# Patient Record
Sex: Male | Born: 1960 | Race: White | Hispanic: Yes | Marital: Married | Smoking: Former smoker
Health system: Southern US, Community
[De-identification: ages and names within clinical notes are randomized; demographics above are authoritative.]

## PROBLEM LIST (undated history)

## (undated) DIAGNOSIS — I10 Essential (primary) hypertension: Secondary | ICD-10-CM

## (undated) DIAGNOSIS — E119 Type 2 diabetes mellitus without complications: Secondary | ICD-10-CM

---

## 2007-02-20 ENCOUNTER — Inpatient Hospital Stay (HOSPITAL_COMMUNITY): Admission: EM | Admit: 2007-02-20 | Discharge: 2007-02-26 | Payer: Self-pay | Admitting: Emergency Medicine

## 2007-04-01 ENCOUNTER — Ambulatory Visit: Payer: Self-pay | Admitting: Gastroenterology

## 2007-04-30 ENCOUNTER — Encounter (INDEPENDENT_AMBULATORY_CARE_PROVIDER_SITE_OTHER): Payer: Self-pay | Admitting: *Deleted

## 2007-04-30 ENCOUNTER — Encounter: Payer: Self-pay | Admitting: Gastroenterology

## 2007-04-30 ENCOUNTER — Ambulatory Visit: Payer: Self-pay | Admitting: Gastroenterology

## 2008-01-29 DIAGNOSIS — Z860100 Personal history of colon polyps, unspecified: Secondary | ICD-10-CM | POA: Insufficient documentation

## 2008-01-29 DIAGNOSIS — K5730 Diverticulosis of large intestine without perforation or abscess without bleeding: Secondary | ICD-10-CM | POA: Insufficient documentation

## 2008-07-26 ENCOUNTER — Ambulatory Visit: Payer: Self-pay | Admitting: Internal Medicine

## 2008-07-26 ENCOUNTER — Inpatient Hospital Stay (HOSPITAL_COMMUNITY): Admission: EM | Admit: 2008-07-26 | Discharge: 2008-07-30 | Payer: Self-pay | Admitting: Emergency Medicine

## 2008-08-13 ENCOUNTER — Inpatient Hospital Stay (HOSPITAL_COMMUNITY): Admission: EM | Admit: 2008-08-13 | Discharge: 2008-08-17 | Payer: Self-pay | Admitting: Emergency Medicine

## 2008-09-01 ENCOUNTER — Inpatient Hospital Stay (HOSPITAL_COMMUNITY): Admission: RE | Admit: 2008-09-01 | Discharge: 2008-09-06 | Payer: Self-pay | Admitting: Surgery

## 2008-09-01 ENCOUNTER — Encounter (INDEPENDENT_AMBULATORY_CARE_PROVIDER_SITE_OTHER): Payer: Self-pay | Admitting: Surgery

## 2009-08-26 IMAGING — CT CT ABDOMEN W/ CM
2 of 5 series · 15 of 46 positions shown, 17 images · IV contrast (APPLIED)
Comparison: CT abdomen pelvis 07/25/2008

CT ABDOMEN

CLINICAL DATA: Diverticulitis.

CT ABDOMEN AND PELVIS WITH CONTRAST
TECHNIQUE: Multidetector CT imaging of the abdomen and pelvis was
performed using the standard protocol following bolus
administration of intravenous contrast.
Contrast: 125 ml Gmnipaque-199

[Series 2: abd_pel 5.0 b40f st · axial · 0.78mm/px · z∈[-446,-36]mm · 12 of 94 slices shown, 14 images]
[im 6/94  soft-tissue]
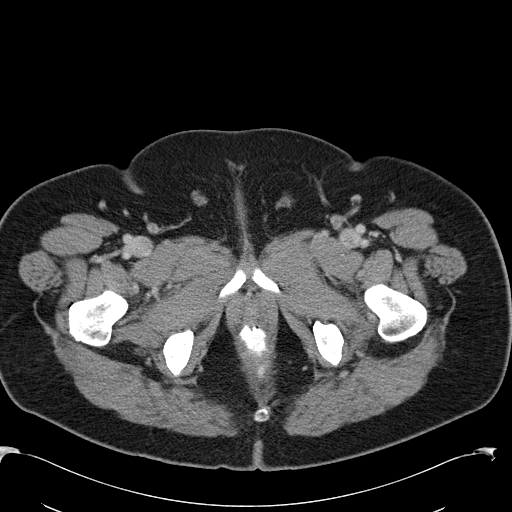
[im 6/94  bone]
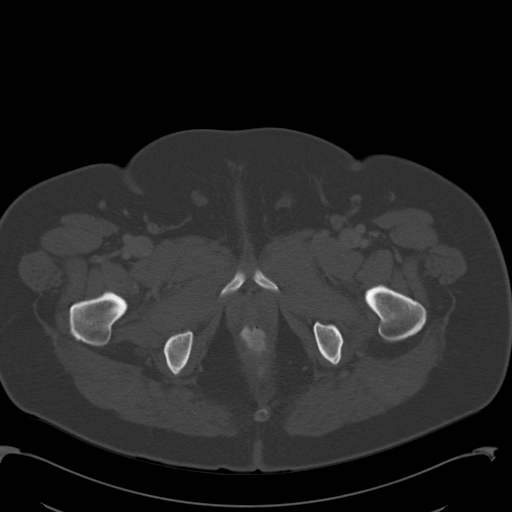
[im 16/94  soft-tissue]
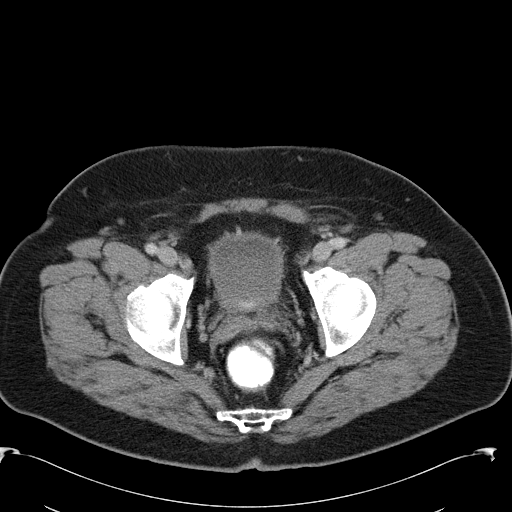
[im 21/94  soft-tissue]
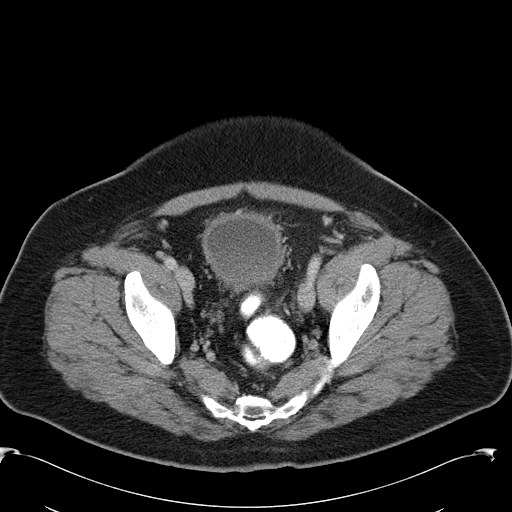
[im 26/94  soft-tissue]
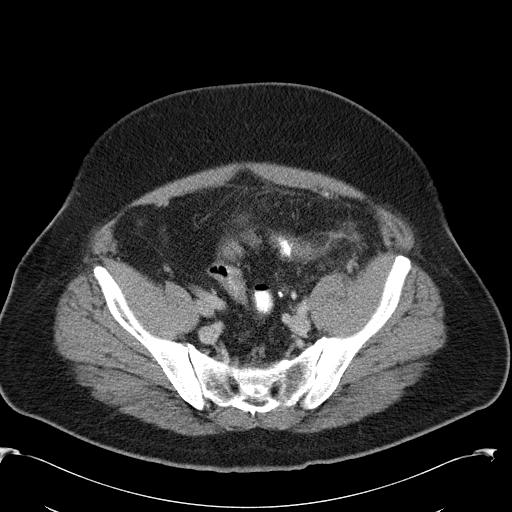
[im 37/94  soft-tissue]
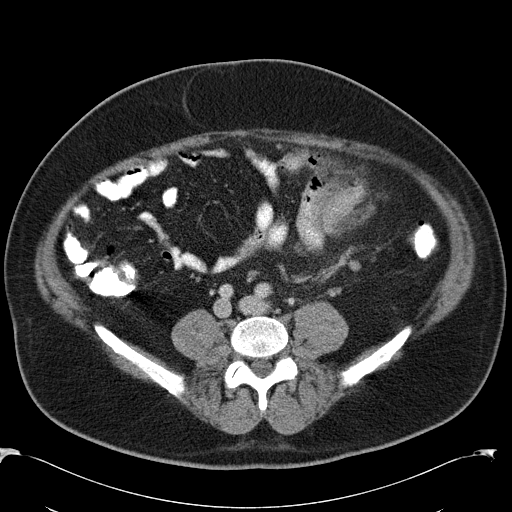
[im 42/94  soft-tissue]
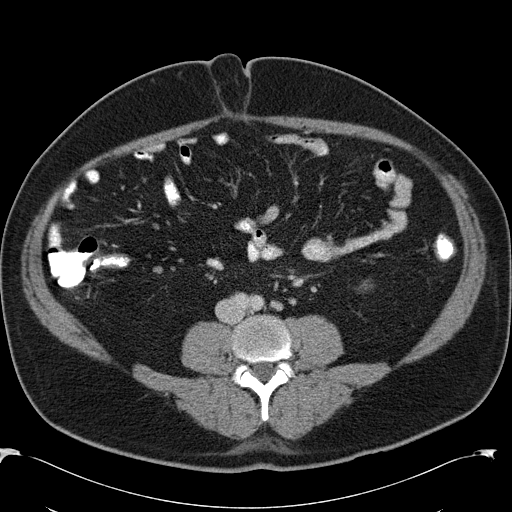
[im 52/94  soft-tissue]
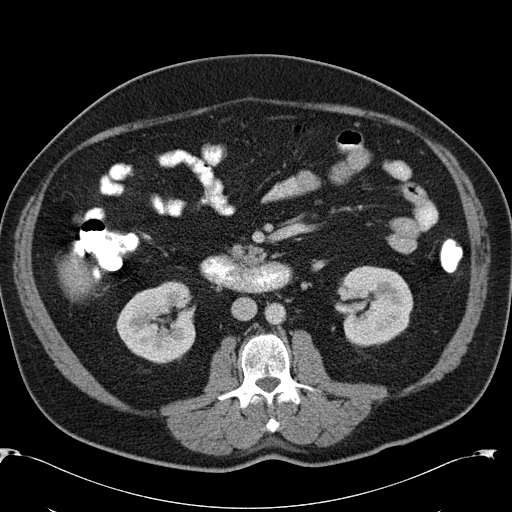
[im 57/94  soft-tissue]
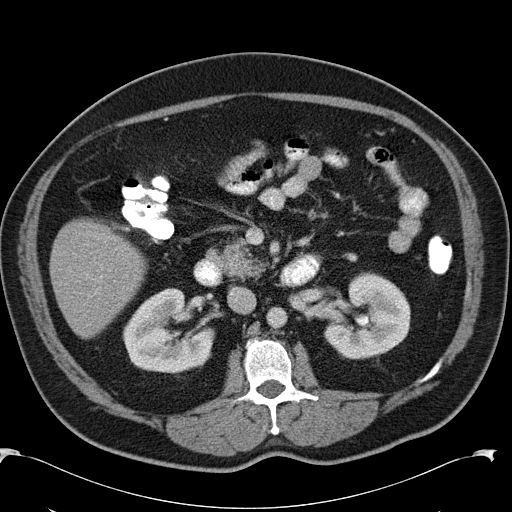
[im 68/94  soft-tissue]
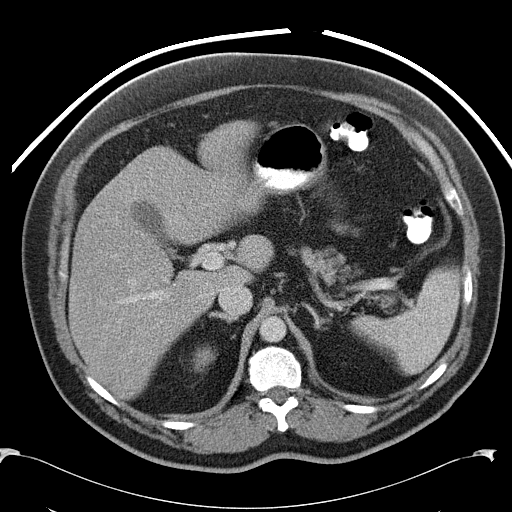
[im 68/94  bone]
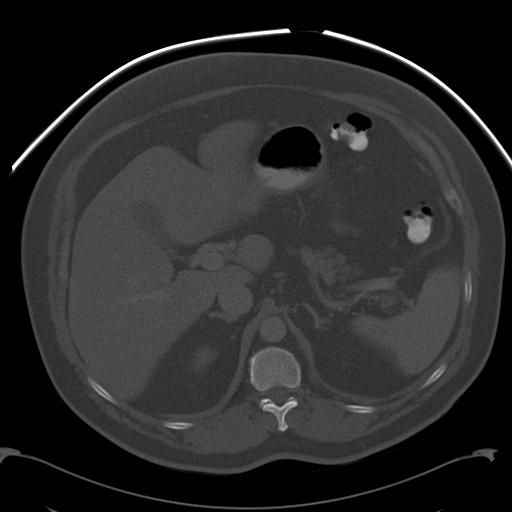
[im 73/94  soft-tissue]
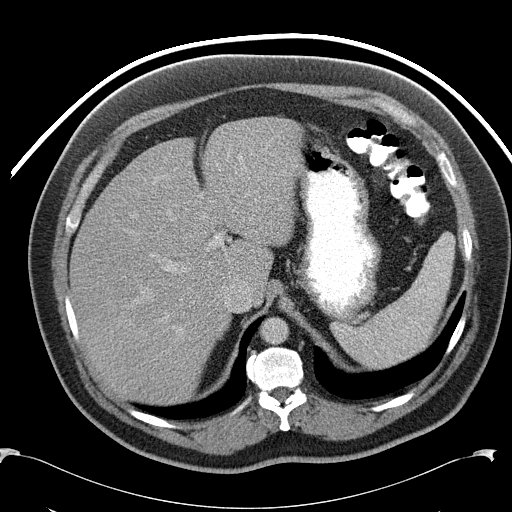
[im 78/94  soft-tissue]
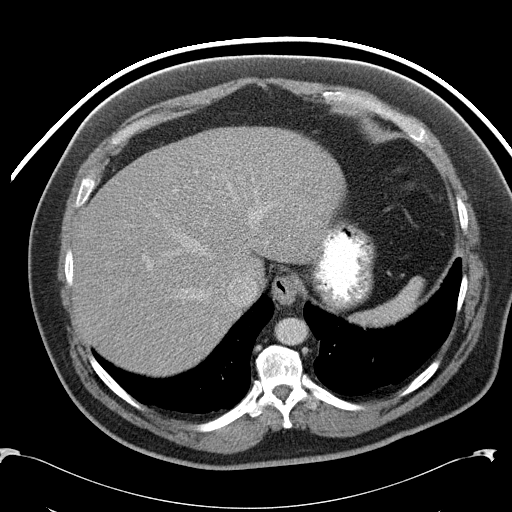
[im 88/94  soft-tissue]
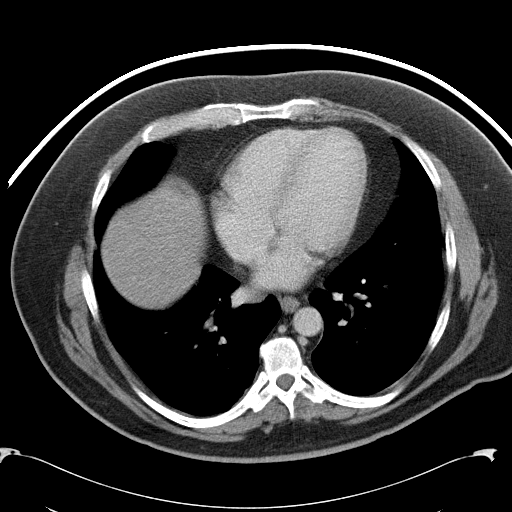

[Series 602: coronal abdomen · coronal · 0.95mm/px · 3 of 150 slices shown]
[im 50/150  soft-tissue]
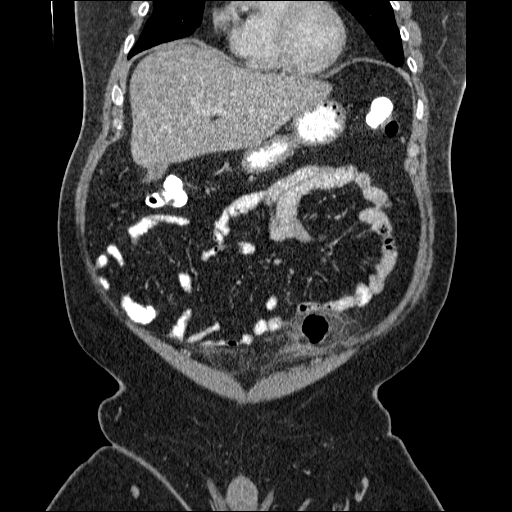
[im 67/150  soft-tissue]
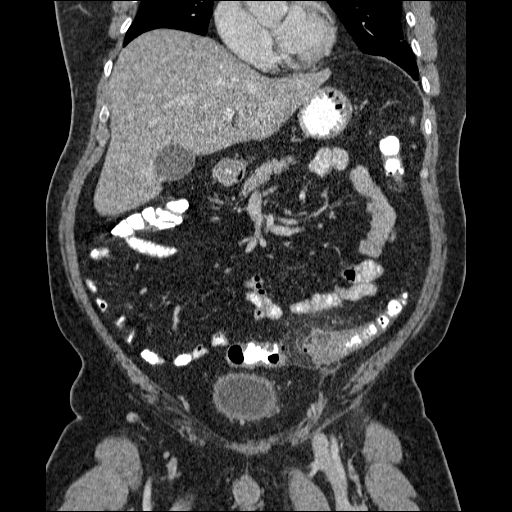
[im 83/150  soft-tissue]
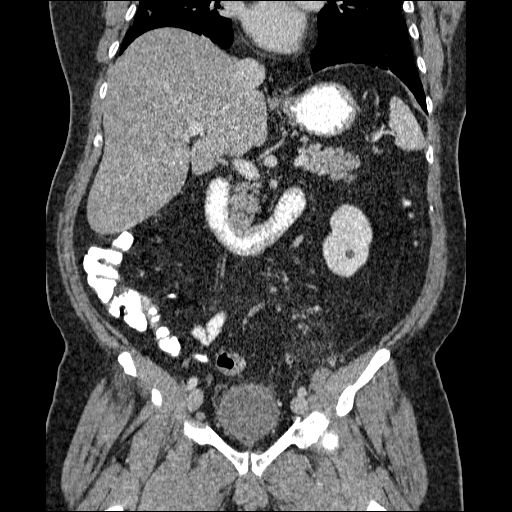

[15 of 46 positions shown; findings below may reference images not displayed]

FINDINGS: Small focal areas of atelectasis at the lung bases.  No
consolidation or pleural effusion is identified.  To the focal
metallic densities are noted just cephalad to the diaphragmatic
hiatus, adjacent to the within the mediastinal fat to the left of
the esophagus.

There is diffuse fatty infiltration of the liver without evidence
of a focal liver lesion.

The gallbladder, adrenal glands, spleen, pancreas, and right kidney
are normal.  Two circumscribed low density lesions in the lower
pole left kidney are noted, largest measuring 1 cm, are most
compatible with renal cyst.  There is no hydronephrosis.  The
ureters are not dilated.

Small bowel loops are normal in caliber and wall thickness.  There
is a paraumbilical hernia containing fat only.  The mouth of the
hernia is 1.7 cm.

Adjacent to the distal aorta is not 9 x 8 mm lymph node, within
normal limits.  No pathologically enlarged lymph nodes are
identified within the abdomen.

No free intraperitoneal air is identified within the abdomen.

No acute osseous abnormality.
IMPRESSION: 1.  No acute findings in the abdomen.
2.  Fatty infiltration of the liver.

CT PELVIS
FINDINGS: The sigmoid colon contains an approximately 7-8 cm
segment of marked wall thickening, in the region of numerous
sigmoid diverticula.  A focal low density area along the wall the
sigmoid colon in the region of thickening measures approximately
1.9 x 2.2 cm, and extends slightly cephalad from the sigmoid colon.
This area is similar in appearance compared to the CT from
07/25/2008 and may represent a small intramural/pericolonic
abscess.  Fat stranding in the region of the sigmoid colon
persists, and overall may be slightly improved compared to
07/25/2008.

There are extraluminal locules of gas in the sigmoid mesentery.
Compared to the prior examination, some of the locules appear
coalesced into one larger locule measuring 2.7 x 2.9 cm, and there
are several additional of punctate locules of gas in this region.
No definite free intraperitoneal gas is identified.  There are
several reactive size lymph nodes adjacent to the inflamed sigmoid
colon, measuring up to 1.0 cm (image #58 of series 2).  Small bowel
loops are not dilated.  The appendix is normal.  No free fluid is
seen in the dependent portion of the pelvis.  Oral contrast is seen
to the level of the rectum. There is no evidence of colonic
obstruction.

Left inguinal lymph node is mildly prominent measuring 1.2 x
cm.
IMPRESSION: 1.
1.  Overall, the acute diverticulitis with contained perforation
within the sigmoid mesentery are similar compared to 07/25/2008 as
described above. The extraluminal gas has coalesced into one larger
gas collection with a few smaller additional foci of gas.
Pericolonic fat stranding is slightly decreased.
2.  A small (1.9 x 2.2 cm) low density collection associated with
the sigmoid colon and extending cephalad may represent a small
intramural / pericolonic abscess.
3.  Reactive lymph nodes in the left lower quadrant.
4.  Consider a follow-up colonoscopy, after the patient's acute
symptoms have completely resolved, to exclude an underlying colonic
neoplasm.

## 2010-06-04 ENCOUNTER — Observation Stay (HOSPITAL_COMMUNITY): Admission: EM | Admit: 2010-06-04 | Discharge: 2010-06-05 | Payer: Self-pay | Admitting: Emergency Medicine

## 2010-06-04 ENCOUNTER — Ambulatory Visit: Payer: Self-pay | Admitting: Cardiovascular Disease

## 2010-06-13 ENCOUNTER — Telehealth: Payer: Self-pay | Admitting: Gastroenterology

## 2010-06-15 ENCOUNTER — Ambulatory Visit: Payer: Self-pay | Admitting: Internal Medicine

## 2010-06-15 ENCOUNTER — Encounter: Payer: Self-pay | Admitting: Gastroenterology

## 2010-06-15 DIAGNOSIS — I10 Essential (primary) hypertension: Secondary | ICD-10-CM | POA: Insufficient documentation

## 2010-06-15 DIAGNOSIS — K219 Gastro-esophageal reflux disease without esophagitis: Secondary | ICD-10-CM | POA: Insufficient documentation

## 2010-06-20 ENCOUNTER — Ambulatory Visit (HOSPITAL_COMMUNITY): Admission: RE | Admit: 2010-06-20 | Discharge: 2010-06-20 | Payer: Self-pay | Admitting: Gastroenterology

## 2010-06-20 ENCOUNTER — Ambulatory Visit: Payer: Self-pay | Admitting: Gastroenterology

## 2010-06-25 ENCOUNTER — Encounter: Payer: Self-pay | Admitting: Gastroenterology

## 2010-07-23 ENCOUNTER — Ambulatory Visit: Payer: Self-pay | Admitting: Gastroenterology

## 2010-07-27 ENCOUNTER — Emergency Department (HOSPITAL_COMMUNITY): Admission: EM | Admit: 2010-07-27 | Discharge: 2010-07-27 | Payer: Self-pay | Admitting: Emergency Medicine

## 2010-08-14 ENCOUNTER — Encounter: Payer: Self-pay | Admitting: Cardiology

## 2010-08-14 ENCOUNTER — Ambulatory Visit: Payer: Self-pay | Admitting: Cardiology

## 2010-08-14 DIAGNOSIS — R079 Chest pain, unspecified: Secondary | ICD-10-CM

## 2010-10-31 NOTE — Letter (Signed)
Summary: EGD Instructions  [facility]  [address] [place], [place] [address]   Phone: [phone]  Fax: [phone]       Jose [name]    November 28, 1960    MRN: [id number]       Procedure Day /Date: 06-20-10     Arrival Time: 9:00 AM      Procedure Time: 10:00 AM     Location of Procedure:                     [name]     [facility] ( Outpatient Registration)   PREPARATION FOR ENDOSCOPY   On 06-20-10 THE DAY OF THE PROCEDURE:  1.   No solid foods, milk or milk products are allowed after midnight the night before your procedure.  2.   Do not drink anything colored red or purple.  Avoid juices with pulp.  No orange juice.  3.  You may drink clear liquids untlil 6:00 AM  which is 4 hours before your procedure.                                                                                                CLEAR LIQUIDS INCLUDE: Water Jello Ice Popsicles Tea (sugar ok, no milk/cream) Powdered fruit flavored drinks Coffee (sugar ok, no milk/cream) Gatorade Juice: apple, white grape, white cranberry  Lemonade Clear bullion, consomm, broth Carbonated beverages (any kind) Strained chicken noodle soup Hard Candy   MEDICATION INSTRUCTIONS  Unless otherwise instructed, you should take regular prescription medications with a small sip of water as early as possible the morning of your procedure.          OTHER INSTRUCTIONS  You will need a responsible adult at least 50 years of age to accompany you and drive you home.   This person must remain in the waiting room during your procedure.  Wear loose fitting clothing that is easily removed.  Leave jewelry and other valuables at home.  However, you may wish to bring a book to read or an iPod/MP3 player to listen to music as you wait for your procedure to start.  Remove all body piercing jewelry and leave at home.  Total time from sign-in until discharge is approximately 2-3 hours.  You should go home  directly after your procedure and rest.  You can resume normal activities the day after your procedure.  The day of your procedure you should not:   Drive   Make legal decisions   Operate machinery   Drink alcohol   Return to work  You will receive specific instructions about eating, activities and medications before you leave.    The above instructions have been reviewed and explained to me by   _______________________    I fully understand and can verbalize these instructions _____________________________ Date _________

## 2010-10-31 NOTE — Miscellaneous (Signed)
  Clinical Lists Changes  Medications: Removed medication of OMEPRAZOLE 20 MG CPDR (OMEPRAZOLE) Take 1 capsule 30 min prior to breakfast and dinner Added new medication of DEXILANT 60 MG CPDR (DEXLANSOPRAZOLE) take 1 tab before breakfast once daily - Signed Rx of DEXILANT 60 MG CPDR (DEXLANSOPRAZOLE) take 1 tab before breakfast once daily;  #30 x 2;  Signed;  Entered by: Louis Meckel MD;  Authorized by: Louis Meckel MD;  Method used: Electronically to [facility]. [phone]*, [address], [place], [place], [place]  [address], Ph: [phone], Fax: [phone]    Prescriptions: DEXILANT 60 MG CPDR (DEXLANSOPRAZOLE) take 1 tab before breakfast once daily  #30 x 2   Entered and Authorized by:   Louis Meckel MD   Signed by:   Louis Meckel MD on 06/20/2010   Method used:   Electronically to        [facility]. [phone]* (retail)       [address]       [place], [place]  [address]       Ph: [phone]       Fax: [phone]   RxID:   [id number]

## 2010-10-31 NOTE — Letter (Signed)
Summary: Results Letter  [facility]  [address] [place], [place] [address]   Phone: [phone]  Fax: [phone]        June 25, 2010 MRN: [id number]    [facility] [address] [place], [place]  [address]    Dear Mr. HONAKER,   Your biopsies demonstrated inflammatory changes only.    Please follow the recommendations previously discussed.  Should you have any immediate concerns or questions, feel free to contact me at the office.    Sincerely,  Barbette Hair. Arlyce Dice, M.D., [facility]          Sincerely,  Louis Meckel MD  This letter has been electronically signed by your physician.  Appended Document: Results Letter Letter mailed to patient.

## 2010-10-31 NOTE — Procedures (Signed)
Summary: Upper Endoscopy w/DIL  Patient: Jose [name] Note: All result statuses are Final unless otherwise noted.  Tests: (1) Upper Endoscopy w/DIL (UED)  UED Upper Endoscopy w/DIL                             DONE     [facility]     [address] [place], [place]  [address]           ENDOSCOPY PROCEDURE REPORT           PATIENT:  Jose [name], Jose [name]  MR#:  [id number]     BIRTHDATE:  05/11/1961, 48 yrs. old  GENDER:  male           ENDOSCOPIST:  Barbette Hair. Arlyce Dice, MD     ASSISTANT:           PROCEDURE DATE:  06/20/2010     PROCEDURE:  EGD with biopsy, EGD with balloon dilatation     ASA CLASS:  Class II     INDICATIONS:  1) dysphagia  2) GERD           MEDICATIONS:   Fentanyl 75 mcg IV, Versed 6 mg IV, glycopyrrolate     (Robinal) 0.2 mg IV     TOPICAL ANESTHETIC:  Cetacaine Spray           DESCRIPTION OF PROCEDURE:   After the risks benefits and     alternatives of the procedure were thoroughly explained, informed     consent was obtained.  The  endoscope was introduced through the     mouth and advanced to the third portion of the duodenum, without     limitations.  The instrument was slowly withdrawn as the mucosa     was carefully examined.     <<PROCEDUREIMAGES>>           A nodule was found at the gastroesophageal junction. 5mm nodule     with normal overlying mucosa on esophageal side of GE junction.     Bxs taken (see image2).  The examination was otherwise normal.  A     stricture was found at the gastroesophageal junction. Early     esophageal stricture    Dilation was then performed at the     gastroesphageal junction           1) Dilator:  Balloon  Size(s):  15-16.5-18     Resistance:  minimal  Heme:  none     Appearance:           COMPLICATIONS:  None           ENDOSCOPIC IMPRESSION:     1) Nodule at the gastroesophageal junction     2) Stricture at the gastroesophageal junction - s/p balloon     dilitation     3) Otherwise normal  examination.     RECOMMENDATIONS:     1) call office to schedule an office visit for _3weeks     2) discontinue prilosec; begin dexilant           REPEAT EXAM:  No           ______________________________     Barbette Hair. Arlyce Dice, MD           CC:  Gabriel Earing, M.D.           n.     eSIGNED:   Barbette Hair. Kaplan at 06/20/2010 11:09  AM           Kreg Shropshire, [id number]  Note: An exclamation mark (!) indicates a result that was not dispersed into the flowsheet. Document Creation Date: 06/20/2010 11:10 AM _______________________________________________________________________  (1) Order result status: Final Collection or observation date-time: 06/20/2010 11:03 Requested date-time:  Receipt date-time:  Reported date-time:  Referring Physician:   Ordering Physician: Melvia Heaps [phone]) Specimen Source:  Source: Launa Grill Order Number: [id number] Lab site:

## 2010-10-31 NOTE — Assessment & Plan Note (Signed)
Summary: NP6/CHEST DISCOMFORT/JML   Referring Loreley Schwall:  na Primary Jeslynn Hollander:  Gabriel Earing, MD   CC:  chect pressure.  History of Present Illness: 50 year old male for evaluation of chest pain. Long history of esophageal problems. Stress echocardiogram in September of 2011 showed a hypertensive response but no ST changes and normal wall motion. Seen in the emergency room with chest pain in October and apparently symptoms improved with GI cocktail. Enzymes negative x1. Patient was noted to have the chest pain following esophageal dilatation. The pain was described as a pressure without radiation. It was substernal in location. There was mild nausea but no shortness of breath or diaphoresis. It did increase with lying flat. It was not exertional. It resolved after 2 hours. He otherwise denies dyspnea on exertion, orthopnea, PND, pedal edema, palpitations, syncope or chest pain. At the time of his chest pain he did note mild palpitations.  Current Medications (verified): 1)  Bisoprolol-Hydrochlorothiazide 10-6.25 Mg Tabs (Bisoprolol-Hydrochlorothiazide) .... One Tablet By Mouth Once Daily 2)  Dexilant 60 Mg Cpdr (Dexlansoprazole) .... Take 1 Tab Before Breakfast Once Daily 3)  Cipro 500 Mg Tabs (Ciprofloxacin Hcl) .[name].. 1 Tab By Mouth Two Times A Day 4)  Garlic .[name].. 1 Tab By Mouth Once Daily 5)  Multivitamins   Tabs (Multiple Vitamin) .[name].. 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 Remaining systems are negative.   Vital Signs:  Patient profile:   50 year old male Height:      68 inches Weight:      225 pounds BMI:     34.33 Pulse rate:   61 / minute Resp:     14 per minute BP sitting:   121 / 77  (left arm)  Vitals Entered By: Kem Parkinson (August 14, 2010 11:14 AM)  Physical Exam  General:  Well developed/well nourished in NAD Skin warm/dry Patient not depressed No peripheral clubbing Back-normal HEENT-normal/normal eyelids Neck supple/normal carotid upstroke bilaterally; no bruits; no JVD; no thyromegaly chest - CTA/ normal expansion CV - RRR/normal S1 and S2; no murmurs, rubs or gallops;  PMI nondisplaced Abdomen -NT/ND, no HSM, no mass, + bowel sounds, no bruit 2+ femoral pulses, no bruits Ext-no edema, chords, 2+ DP Neuro-grossly nonfocal     EKG  Procedure date:  08/14/2010  Findings:      Sinus rhythm with pvcs, occasional junctional escape beats and nonspecific Twave changes.  Impression & Recommendations:  Problem # 1:  CHEST PAIN (ICD-786.50) Symptoms seem most consistent with GI pain. Stress echocardiogram normal. No further cardiac workup. His updated medication list for this problem includes:    Bisoprolol-hydrochlorothiazide 10-6.25 Mg Tabs (Bisoprolol-hydrochlorothiazide) ..... One tablet by mouth once daily  Problem # 2:  GERD (ICD-530.81) Management per primary care. His updated medication list for this problem includes:    Dexilant 60 Mg Cpdr (Dexlansoprazole) .[name]... Take 1 tab before breakfast once daily  Problem # 3:  HYPERTENSION  (ICD-401.9) Blood pressure controlled on present medications. Will continue. His updated medication list for this problem includes:    Bisoprolol-hydrochlorothiazide 10-6.25 Mg Tabs (Bisoprolol-hydrochlorothiazide) ..... One tablet by mouth once daily  Problem # 4:  DIVERTICULOSIS OF COLON (ICD-562.10)  His updated medication list for this problem includes:    Cipro 500 Mg Tabs (Ciprofloxacin hcl) .[name]... 1 tab by mouth two times a day

## 2010-10-31 NOTE — Procedures (Signed)
Summary: Preparation for Endoscopy / [facility]  Preparation for Endoscopy / [facility]   Imported By: Lennie Odor 06/19/2010 14:56:33  _____________________________________________________________________  External Attachment:    Type:   Image     Comment:   External Document

## 2010-10-31 NOTE — Letter (Signed)
Summary: Results Letter  [facility]  [address] [place], [place] [address]   Phone: [phone]  Fax: [phone]        June 25, 2010 MRN: [id number]    [facility] [address] [place], [place]  [address]    Dear Mr. BECKMANN,   Your biopsies demonstrated inflammatory changes only.    Please follow the recommendations previously discussed.  Should you have any immediate concerns or questions, feel free to contact me at the office.    Sincerely,  Barbette Hair. Arlyce Dice, M.D., [facility]          Sincerely,  Louis Meckel MD  This letter has been electronically signed by your physician.  Appended Document: Results Letter Repeat letter, original mailed to patient.

## 2010-10-31 NOTE — Progress Notes (Signed)
Summary: Triage-Dysphagia  Phone Note Call from Patient Call back at [phone]   Caller: Patient Call For: Dr Arlyce Dice Reason for Call: Talk to Nurse Summary of Call: Patient is scheduled to see Dr Arlyce Dice on 10-14 but would like to speak to nurse regarding issues he's been having. Patient states that he has reflux and feels something stuck in his esophagus. Initial call taken by: Tawni Levy,  June 13, 2010 1:33 PM  Follow-up for Phone Call        Pt. having episodes of GERD that wake him up during the night, episodes becomming more frequent. 1 episode of dysphagia, happened today.   1) See Amy Esterwood PAC on 06-15-10 at 2pm 2) Prilosec OTC two times a day until appt. 3) Soft,bland diet. Be very careful with meats,rice and breads. 4) If symptoms become worse call back immediately or go to ER.  Follow-up by: Laureen Ochs LPN,  June 13, 2010 2:52 PM  Additional Follow-up for Phone Call Additional follow up Details #1::        ok Additional Follow-up by: Louis Meckel MD,  June 14, 2010 2:39 PM

## 2010-10-31 NOTE — Assessment & Plan Note (Signed)
Summary: WORSENING GERD/DYSPHAGIA   (DR.KAPLAN PT.)        History of Present Illness Visit Type: new patient  Primary GI MD: Melvia Heaps MD [facility] Primary Provider: Gabriel Earing, MD  Requesting Provider: na Chief Complaint: Severe GERD and nausea  History of Present Illness:    PLEASANT 50 Y.O MALE KNOWN TO DR. KAPLAN WITH HX OF DIVERTICULAR DISEASE . HE HAD A COLONOSCOPY IN 7/08 WITH LEFT SIDED DIVERTICULOSIS,AND A SMALL HAMARTOMATOUS POLYP. HE HAD A  SIGMOID RESECTION IN 5/09.  HE COMES IN NOW WITH A NEW COMPLAINT. HE HAS BEEN HAVING FREQUENT HEARTBURN,INCLUDING NOCTURNAL EPISODES OF REFLUX WAKING HIM UP. HE HAD AN EPISODE OF PRESYNCOPE /DIZZINESS ON 9/5 AFTER MOVING HIS LAWN WHICH TOOK HIM TO THE ER. HE HAD EVALUATION INCLUDING A STRESS TEST WHICH WAS REPORTED AS NEGATIVE, AND WAS TOLD HE HAD HEAT EXHAUSTION/DEHYDRATION. ON 06/06/10 HE ATE A BANANA THEN FELT AS IF IT SAT IN HIS ESOPHAGUS.[name] HE DEVELOPEDD A PRESSURE IN HIS CHEST AND ESOPHAGUS. WHICH CAME UP TO HIS THROAT.HE WAS UNCOMFORATBLE THE REST OF THE DAY. HE HAS FELT UNCOMFORTABLE  EATING EVER SINCE, AND IS EATING VERY SOFT MUSHY FOODS WHICH ARE GOING DOWN FINE. IF HE EATS SOMETHING SOLID HE FEELS PRESSURE IN HIS CHEST. OCCASIONAL VAGUE NAUSEA, NO VOMITING OR REGURGITATION. NO C/O ABDOMINAL PAIN. HE STARTED PRILOSEC TWICE DAILY 2 DAYS AGO, NO HEARTBURN SINCE.    GI Review of Systems    Reports acid reflux, chest pain, dysphagia with solids, heartburn, loss of appetite, and  nausea.      Denies abdominal pain, belching, bloating, dysphagia with liquids, vomiting, vomiting blood, weight loss, and  weight gain.      Reports diverticulosis.     Denies anal fissure, black tarry stools, change in bowel habit, constipation, diarrhea, fecal incontinence, heme positive stool, hemorrhoids, irritable bowel syndrome, jaundice, light color stool, liver problems, rectal bleeding, and  rectal pain.    Current Medications (verified): 1)  Prilosec  Otc 20 Mg Tbec (Omeprazole Magnesium) .... One Tablet By Mouth in The Morning and One Tablet By Mouth At Bedtime 2)  Bisoprolol-Hydrochlorothiazide 10-6.25 Mg Tabs (Bisoprolol-Hydrochlorothiazide) .... 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  Alcohol Use - no Daily Caffeine Use: one daily  Illicit Drug Use - no Smoking Status:  quit Drug Use:  no  Review of Systems       The patient complains of headaches-new.  The patient denies allergy/sinus, anemia, anxiety-new, arthritis/joint pain, back pain, blood in urine, breast changes/lumps, change in vision, confusion, cough, coughing up blood, depression-new, fainting, fatigue, fever, hearing problems, heart murmur, heart rhythm changes, itching, muscle pains/cramps, night sweats, nosebleeds, shortness of breath, skin rash, sleeping problems, sore throat, swelling of feet/legs, swollen lymph glands, thirst - excessive, urination - excessive, urination changes/pain, urine leakage, vision changes, and voice change.         SEE HPI  Vital Signs:  Patient profile:   50 year old male Height:      68 inches Weight:      228 pounds BMI:     34.79 BSA:     2.16  Pulse rate:   88 / minute Pulse rhythm:   regular BP sitting:   142 / 86  (left arm) Cuff size:   regular  Vitals Entered By: Ok Anis CMA (June 15, 2010 2:07 PM)  Physical Exam  General:  Well developed, well nourished, no acute distress. Head:  Normocephalic and atraumatic. Eyes:  PERRLA, no icterus. Lungs:  Clear throughout to  auscultation. Heart:  Regular rate and rhythm; no murmurs, rubs,  or bruits. Abdomen:  OBESE, SOFT, NONTENDER, NO MASS OR HSM,BS+ Rectal:  NOT DONE Extremities:  No clubbing, cyanosis, edema or deformities noted. Neurologic:  Alert and  oriented x4;  grossly normal neurologically. Psych:  Alert and cooperative. Normal mood and affect.   Impression & Recommendations:  Problem # 1:  DYSPHAGIA (ICD-787.29) Assessment New 50 YO MALE WITH FREQUENT HEARTBURN,PREVIOUSLY UNTREATED, NOCTURNAL REFLUX, NOW WITH ONE WEEK HX OF SOLID FOOD DYSPHAGIA/ODYNOPHAGIA. 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  ANTIREFLUX REGIMEN REVIEWED AND EDUCATIONAL MATERIALS GIVEN CONTINUE PRILOSEC 20 MG  TWICE DAILY FOR NOW. SCHEDULE FOR UPPER ENDOSCOPY  WITH ESOPHAGEAL DILATION WITH DR. KAPLAN NEXT WEEK-PROCEDURE DISCUSSED IN DETAIL WITH PT AND HIS WIFE. CONTINUE SOFT DIET.  Problem # 2:  GERD (ICD-530.81) Assessment: Comment Only SEE ABOVE Orders: ZEGD Balloon Dil (ZEGD Balloon)  Problem # 3:  DIVERTICULOSIS OF COLON (ICD-562.10) Assessment: Comment Only S/P SIGMOID RESECTION 5/09  Problem # 4:  COLONIC POLYPS, HX OF (ICD-V12.72) Assessment: Comment Only HAMARTOMATOUS-FOLLOW UP COLON 2018  Problem # 5:  HYPERTENSION (ICD-401.9) Assessment: Comment Only  Problem # 6:  DIZZINESS Assessment: Comment Only PT MENTIONS SOME DIZZINESS ASSOCIATED  WITH DYSPHAGIA,INTERMITTENTLY-RECENT STRESS TEST REPORTED AS NEGATIVE. ? ANXIETY. IF SXS PERSIST  WILL NEE FURTHER WORKUP.  Patient Instructions: 1)  We have given you samples of Prilosec, take 1 capsule 30 min prior to breakfast  and dinner. 2)  We sent a prescription for Omeprazole to [facility]. 3)  We scheduled the Endoscopy with Dr Melvia Heaps at [facility] on the 1st floor. 4)  Directions and brochure provided. 5)  Reflux precaution brochures provided. 6)  Copy sent to : Gabriel Earing, MD 7)  The medication  list was reviewed and reconciled.  All changed / newly prescribed medications were explained.  A complete medication list was provided to the patient / caregiver. Prescriptions: OMEPRAZOLE 20 MG CPDR (OMEPRAZOLE) Take 1 capsule 30 min prior to breakfast and dinner  #60 x 3   Entered by:   Lowry Ram NCMA   Authorized by:   Sammuel Cooper PA-c   Signed by:   Lowry Ram NCMA on 06/15/2010   Method used:   Electronically to        [facility]. [phone]* (retail)       [address]       [place], [place]  [address]       Ph: [phone]       Fax: [phone]   RxID:   [id number]

## 2010-10-31 NOTE — Assessment & Plan Note (Signed)
Summary: f/u from endo--ch.   History of Present Illness Visit Type: Follow-up Visit Primary GI MD: Jose Heaps MD [facility] Primary Provider: Gabriel Earing, MD  Requesting Provider: na Chief Complaint: follow-up Endo denies heartburn but does c/o watery mouth, dizziness, fast heartbeat, and nausea.   History of Present Illness:   [name] has returned following t upper endoscopy and dilatation.  A distal stricture was dilated to 18 mm.  On a regimen of DEXILANT he is now completely free of pyrosis.  He no longer has dysphagia.   GI Review of Systems    Reports nausea.      Denies abdominal pain, acid reflux, belching, bloating, chest pain, dysphagia with liquids, dysphagia with solids, heartburn, loss of appetite, vomiting, vomiting blood, weight loss, and  weight gain.        Denies anal fissure, black tarry stools, change in bowel habit, constipation, diarrhea, diverticulosis, fecal incontinence, heme positive stool, hemorrhoids, irritable bowel syndrome, jaundice, light color stool, liver problems, rectal bleeding, and  rectal pain.    Current Medications (verified): 1)  Bisoprolol-Hydrochlorothiazide 10-6.25 Mg Tabs (Bisoprolol-Hydrochlorothiazide) .... One Tablet By Mouth Once Daily 2)  Dexilant 60 Mg Cpdr (Dexlansoprazole) .... Take 1 Tab Before Breakfast Once Daily  Allergies (verified): No Known Drug Allergies  Past History:  Past Medical History: Reviewed history from 06/15/2010 and no changes required.  GERD (ICD-530.81) HYPERTENSION (ICD-401.9) COLONIC POLYPS, HX OF (ICD-V12.72)/HAMARTOMATOUS 2008 DIVERTICULOSIS OF COLON (ICD-562.10)  Past Surgical History: Reviewed history from 06/15/2010 and no changes required. Colon Resection--2009  COLONOSOPY 7/08 Jose [name])  Family History: Reviewed history from 06/15/2010 and no changes required. No FH of Colon Cancer: Family History of Diabetes: Mother   Social History: Reviewed history from 06/15/2010 and no  changes required. 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  Alcohol Use - no Daily Caffeine Use: one daily  Illicit Drug Use - no  Review of Systems       The patient complains of headaches-new.  The patient denies allergy/sinus, anemia, anxiety-new, arthritis/joint pain, back pain, blood in urine, breast changes/lumps, change in vision, confusion, cough, coughing up blood, depression-new, fainting, fatigue, fever, hearing problems, heart murmur, heart rhythm changes, itching, menstrual pain, muscle pains/cramps, night sweats, nosebleeds, pregnancy symptoms, shortness of breath, skin rash, sleeping problems, sore throat, swelling of feet/legs, swollen lymph glands, thirst - excessive , urination - excessive , urination changes/pain, urine leakage, vision changes, and voice change.         He has had a couple of episodes of lightheadedness, excess salivation and nausea with tachycardia  Vital Signs:  Patient profile:   50 year old male Height:      68 inches Weight:      225 pounds BMI:     34.33 Pulse rate:   68 / minute Pulse rhythm:   regular BP sitting:   138 / 82  (left arm)  Vitals Entered By: Jose [name] NCMA (July 23, 2010 1:45 PM)   Impression & Recommendations:  Problem # 1:  DYSPHAGIA (ICD-787.29) Assessment Improved Plan repeat dilatation p.r.n. for a esophageal stricture  Problem # 2:  GERD (ICD-530.81)  Plan to continue DEXILANT and attempt to wean  Problem # 3:  COLONIC POLYPS, HX OF (ICD-V12.72) Hamartomatous polyp removed in 2008.  Plan followup colonoscopy in 2013  Patient Instructions: 1)  Copy sent to :Jose Earing, MD   2)  Call back as needed  3)  The medication list was reviewed and reconciled.  All  changed / newly prescribed medications were explained.  A complete medication list was provided to the patient / caregiver.

## 2010-11-01 ENCOUNTER — Telehealth (INDEPENDENT_AMBULATORY_CARE_PROVIDER_SITE_OTHER): Payer: Self-pay | Admitting: *Deleted

## 2010-11-07 NOTE — Progress Notes (Signed)
Summary: Records Request  Faxed OV & EKG to Dr. Gabriel Earing at [facility] ([phone]).  Debby Freiberg  November 01, 2010 2:13 PM

## 2010-12-12 LAB — POCT CARDIAC MARKERS: Troponin i, poc: <0.05 ng/mL (ref 0.00–0.09)

## 2010-12-13 LAB — POCT CARDIAC MARKERS
CKMB, poc: <1 ng/mL — ABNORMAL LOW (ref 1.0–8.0)
CKMB, poc: <1 ng/mL — ABNORMAL LOW (ref 1.0–8.0)
Myoglobin, poc: 39 ng/mL (ref 12–200)
Myoglobin, poc: 55.7 ng/mL (ref 12–200)
Myoglobin, poc: 64.3 ng/mL (ref 12–200)
Troponin i, poc: <0.05 ng/mL (ref 0.00–0.09)
Troponin i, poc: <0.05 ng/mL (ref 0.00–0.09)
Troponin i, poc: <0.05 ng/mL (ref 0.00–0.09)

## 2010-12-13 LAB — POCT I-STAT, CHEM 8
BUN: 15 mg/dL (ref 6–23)
Calcium, Ion: 1.14 mmol/L (ref 1.12–1.32)
Chloride: 106 mEq/L (ref 96–112)
Creatinine, Ser: 0.8 mg/dL (ref 0.4–1.5)
TCO2: 24 mmol/L (ref 0–100)

## 2010-12-13 LAB — CBC
HCT: 44.4 % (ref 39.0–52.0)
Hemoglobin: 15.2 g/dL (ref 13.0–17.0)
MCV: 85.5 fL (ref 78.0–100.0)
RBC: 5.19 MIL/uL (ref 4.22–5.81)
WBC: 14.5 10*3/uL — ABNORMAL HIGH (ref 4.0–10.5)

## 2010-12-13 LAB — DIFFERENTIAL
Eosinophils Relative: 1 % (ref 0–5)
Lymphocytes Relative: 13 % (ref 12–46)
Lymphs Abs: 1.9 10*3/uL (ref 0.7–4.0)
Monocytes Absolute: 1 10*3/uL (ref 0.1–1.0)
Neutro Abs: 11.5 10*3/uL — ABNORMAL HIGH (ref 1.7–7.7)

## 2010-12-13 LAB — URINALYSIS, ROUTINE W REFLEX MICROSCOPIC
Ketones, ur: NEGATIVE mg/dL
Nitrite: NEGATIVE
Urobilinogen, UA: 0.2 mg/dL (ref 0.0–1.0)
pH: 5 (ref 5.0–8.0)

## 2011-02-12 NOTE — Discharge Summary (Signed)
NAME:  Jose [name], Jose [name]              ACCOUNT NO.:  [id number]   MEDICAL RECORD NO.:  [id number]          PATIENT TYPE:  INP   LOCATION:  1522                         [facility]ACILITY[facility]  [facility]   PHYSICIAN:  Thomasenia Bottoms, MDDATE OF BIRTH:  1961/07/08   DATE OF ADMISSION:  02/20/2007  DATE OF DISCHARGE:  02/26/2007                               DISCHARGE SUMMARY   ADDENDUM:  Please refer to the discharge summary already dictated by Dr.  Darnelle Catalan.  The patient remained in the hospital for 2 days after that  discharge summary was dictated.  He continued to have a lot of trouble  with bloating and some mild abdominal pain once his diet was advanced;  therefore, we decided to keep his in the hospital until he was less  symptomatic.   By the morning of discharge, he is able to eat solid food.  No  significant abdominal pain.  He is having normal bowel movements, not  diarrhea, and is feeling significantly better.  He is afebrile.  His  white count is still elevated; however, clinically, he is doing much  better and will be discharged to continue his course of antibiotics.   FOLLOWUP INSTRUCTIONS:  He will follow up with Dr. Manus Rudd of  general surgery 2-3 weeks after discharge.   MEDICATIONS ON DISCHARGE:  Include Cipro 500 mg p.o. b.i.d. #14 given  and Flagyl 500 mg p.o. q.6 h. #28 given.   Again, refer to the discharge summary dictated by Dr. Ethelene Hal for details  of hospital course and problem list.      Thomasenia Bottoms, MD  Electronically Signed     CVC/MEDQ  D:  02/26/2007  T:  02/26/2007  Job:  [id number]   cc:   Ulis Rias, MD  [facility]  [facility]

## 2011-02-12 NOTE — Consult Note (Signed)
NAME:  Jose [name], Jose [name]              ACCOUNT NO.:  [id number]   MEDICAL RECORD NO.:  [id number]          PATIENT TYPE:  INP   LOCATION:  1223                         FACILITY:  [facility]   PHYSICIAN:  [name]. [name], M.D. DATE OF BIRTH:  05/18/1961   DATE OF CONSULTATION:  DATE OF DISCHARGE:                                 CONSULTATION   DATE OF CONSULTATION:  02/20/2007.   CONSULTING PHYSICIAN:  Dr. Trula Ore Rama [facility] hospitalist.   REASON FOR CONSULTATION:  Diverticulitis.   HISTORY OF PRESENT ILLNESS:  The patient is a 50 year old Hispanic male  who was on a business trip 2 days ago when he began experiencing some  left lower quadrant pain.[name]  He initially felt that he was constipated.  He took some over-the-counter laxatives and has had some bowel movements  but there has been no relief in his left lower quadrant pain.  He denies  any fever.  He did have some chills.  He denies any hematochezia or  melena.  This morning he felt lightheaded and had a presyncopal episode.  He was brought to the emergency department for evaluation.   PAST MEDICAL HISTORY:  None.   PAST SURGICAL HISTORY:  None.   ALLERGIES:  None.   MEDICATIONS:  None.   FAMILY HISTORY:  His father is deceased from pneumonia and alcoholism.  Mother is alive with diabetes.   SOCIAL HISTORY:  Nonsmoker, nondrinker.  The patient is a Orthoptist.   PHYSICAL EXAMINATION:  Temperature currently is 98.3, heart rate 98,  blood pressure 126/87.  This is a well-developed, well-nourished male in  no apparent distress.  He has been vomiting some in the recent past.  HEENT:  EOMI.  Sclerae anicteric.  NECK:  No masses, no thyromegaly.  LUNGS:  Clear.  Normal respiratory effort.  HEART:  Regular rate and rhythm.  No murmur.  ABDOMEN:  The patient has a soft reducible umbilical hernia.  He is  moderately obese.  He has significant left lower quadrant tenderness but  no tenderness in other  quadrants.   LABS:  White count 16.6, hemoglobin 14.0, sodium 139, potassium 3.2.  CT scan showed inflammation around the descending and sigmoid colon with  several small dots of extraluminal air consistent with micro  perforations   IMPRESSION:  Sigmoid diverticulitis with micro perforation but no sign  of sepsis at this time.   PLAN:  We will try to manage this patient medically with n.p.o. status  and IV antibiotics.  If his condition worsens, then he will need urgent  exploration with sigmoid resection, probable colostomy temporarily.  I  explained all this to the patient.  He understands and agrees with the  plan.  We will continue to follow with you.      [name]. Tsuei, M.D.  Electronically Signed     MKT/MEDQ  D:  02/20/2007  T:  02/20/2007  Job:  [id number]

## 2011-02-12 NOTE — Consult Note (Signed)
NAME:  Jose [name], Jose [name] NO.:  [id number]   MEDICAL RECORD NO.:  [id number]          PATIENT TYPE:  INP   LOCATION:  1603                         FACILITY:  [facility]   PHYSICIAN:  Velora Heckler, MD      DATE OF BIRTH:  July 04, 1961   DATE OF CONSULTATION:  07/26/2008  DATE OF DISCHARGE:                                 CONSULTATION   REFERRING PHYSICIAN:  [facility], Chriss Driver, MD, Emergency  Department at [facility].   REASON FOR CONSULTATION:  Acute diverticulitis with micro perforation  and abscess.   HISTORY OF PRESENT ILLNESS:  Jose [name] is a 50 year old Hispanic  male admitted from the emergency department onto the medical service  with acute diverticulitis with micro perforation.  The patient had been  admitted in May of 2008 with a similar process.  He was managed  medically with bowel rest and intravenous antibiotics.  He resolved.  He  subsequently underwent a follow-up colonoscopy by Barbette Hair. Arlyce Dice, MD,  [facility] Graham.  He saw [name]. Tsuei, M.D. in our practice both during his  hospitalization and post hospitalization for evaluation.  The patient  opted not to have surgical resection.  Three days ago the patient  developed recurrent abdominal pain.  He was seen at [facility].  He was  started on Avelox and pain medication.  Over the past 2 days pain  persisted.  He presented to the emergency department for assessment.  He  was found to have an elevated white blood cell count.  CT scan abdomen  and pelvis shows recurrent sigmoid diverticulitis with contained  perforation and small abscess formation.  The patient was admitted on  the medical service and started on intravenous antibiotics.  General  surgery is called in consultation.   PAST MEDICAL HISTORY:  History of diverticular disease.   MEDICATIONS:  None.   ALLERGIES:  None known.   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  He quit smoking  approximately 20 years ago.  He denies alcohol  or drug use.   FAMILY HISTORY:  Noncontributory.   The 15-system review without significant other findings except as noted.   EXAM:  A 50 year old well-developed, well-nourished Hispanic male in no  acute distress.  Temperature 100.2, pulse 111, respirations 20, blood pressure 138/91, O2  saturation 94% on room air.  HEENT:  Shows him to be normocephalic, atraumatic.  Sclerae clear.  Conjunctivae clear.  Pupils equal and reactive.  Dentition fair.  Mucous  membranes moist.  Voice normal.  NECK:  Shows no palpable mass.  No tenderness.  Auscultation of the chest shows breath sounds bilaterally without rales,  rhonchi or wheeze.  CARDIAC EXAM:  Shows regular rate and rhythm without murmur.  ABDOMEN:  Soft, mildly obese.  There are bowel sounds present.  The  patient did have a diarrheal stool this morning.  There is a reducible  umbilical hernia which is asymptomatic.  There is mild tenderness to  palpation in the left lower quadrant without guarding or rebound.  There  is no  palpable mass.  EXTREMITIES:  Nontender without edema.  NEUROLOGICALLY:  The patient is alert and oriented.  There is no sign of  tremor.   LABORATORY STUDIES:  White count 16, hemoglobin 14.7, platelet count  233,000.  Electrolytes are normal.  Liver function tests are normal.   RADIOGRAPHIC STUDIES:  CT scan abdomen and pelvis was reviewed.  There  is a small mural abscess measuring less than 2 cm in size.  There are a  few small air bubbles adjacent to the distal sigmoid colon.  There is no  extravasation of contrast.  Contrast does reach the rectum.   IMPRESSION:  Sigmoid diverticulitis, recurrent, with contained  perforation and abscess.   PLAN:  The patient and I discussed options for management.  At this  point, he would like to avoid surgery and colostomy placement, although  that may be required.  We will give him a trial of bowel rest and   intravenous antibiotics.  Hopefully, he will resolve as he did  previously.  If so, the patient does need to come to surgery sometime in  the next 6-8 weeks for surgical resection of the sigmoid colon.   The patient identifies [name]. [name], M.D. as his surgeon of record.  I will notify [name]. Tsuei, M.D. of the patient's admission to the  hospital.      Velora Heckler, MD  Electronically Signed     TMG/MEDQ  D:  07/26/2008  T:  07/26/2008  Job:  [id number]   cc:   Chriss Driver, MD   [name]. [name], M.D.  [address] [address]  [place]

## 2011-02-12 NOTE — Discharge Summary (Signed)
NAME:  Jose [name], Jose [name]              ACCOUNT NO.:  [id number]   MEDICAL RECORD NO.:  [id number]          PATIENT TYPE:  INP   LOCATION:  1522                         FACILITY:  [facility]   PHYSICIAN:  Hillery Aldo, M.D.   DATE OF BIRTH:  [birth year]   DATE OF ADMISSION:  02/20/2007  DATE OF DISCHARGE:                               DISCHARGE SUMMARY   PRIMARY CARE PHYSICIAN:  None.   DISCHARGE DIAGNOSES:  1. Diverticulitis with microperforation.  2. Impaired fasting glucose.  3. Hypertension.  4. Hypokalemia.  5. Fatty liver.   DISCHARGE MEDICATIONS:  To be dictated at the time of actual discharge.   CONSULTATION:  [name]. [name], M.D., of general surgery.   BRIEF ADMISSION HISTORY OF PRESENT ILLNESS:  The patient is a 50-year-  old male who presented with a chief complaint of left lower quadrant  pain accompanied by nausea and diaphoresis.  He became concerned when he  got presyncopal and actually had a syncopal episode while waiting in the  emergency department for evaluation.  She was admitted for further  evaluation and workup.  For the full details, please see my dictated  admission H&P.   PROCEDURES AND DIAGNOSTIC STUDIES:  1. CT scan of the abdomen and pelvis on Feb 20, 2007, showed a diffuse      fatty infiltration of the liver. There were  also small left renal      lesions, likely cysts.  There was sigmoid diverticulitis or      perforation with a few regional extraluminal gas bubbles but no      obvious abscess.  2. One-view of the abdomen on Feb 22, 2007, showed relative paucity of      gas throughout the abdomen.  No definite obstruction.  No free air.   DISCHARGE LABORATORY VALUES:  To be dictated at the time of actual  discharge.   HOSPITAL COURSE BY PROBLEM:  PROBLEM #1 - DIVERTICULITIS WITH  MICROPERFORATION:  The patient was admitted and empirically put on  Zosyn.  He remained hemodynamically stable through the course of his  hospitalization.  He was  initially monitored closely in the step-down  unit and was transferred to the floor once his stability was ensured.  A  surgical consultation was requested and kindly provided by Dr. [name] who  followed the patient along.  There were no immediate indications for  surgery and the patient improved clinically with conservative therapy  including antibiotics and bowel rest.  At this point, his white blood  cell count has continued to decline and has almost normalized.  He is  clinically improved with decreased pain.  He is tolerating a liquid diet  and will be advanced and, if he remained stable, will likely be  discharged home over the next 1-2 days with further workup as an  outpatient by Dr. [name].   PROBLEM #2 - IMPAIRED FASTING GLUCOSE:  The patient's sugars were  checked early in his hospitalization.  They were mostly normal and he  rarely required sliding scale insulin.  At this point, we have obtained  a dietician consult for diet  teaching regarding low carbohydrate diet as  he is at high a risk for the development of diabetes.   PROBLEM #3 - HYPERTENSION:  The patient has been somewhat hypertensive.  Since he is not currently having any significant abdominal pain, the  decision has been made to start him on Norvasc for treatment.  He will  need an outpatient follow-up with a primary care physician for ongoing  surveillance and management.   PROBLEM #4 - HYPOKALEMIA:  The patient's potassium was repleted as  needed.   PROBLEM #5 - FATTY LIVER:  The patient does have a fatty liver, this  likely will respond to diet changes, however, he will need a outpatient  physician to manage this as well as his hypertension, slight obesity,  and risk for the metabolic syndrome.  He also have his lipids checked.   DISPOSITION:  The patient will likely be discharged home in the next 24-  48 hours.  He should be referred to the primary care physician for  follow-up.      Hillery Aldo,  M.D.  Electronically Signed     CR/MEDQ  D:  02/24/2007  T:  02/24/2007  Job:  [id number]

## 2011-02-12 NOTE — H&P (Signed)
NAME:  Jose [name], Jose [name] NO.:  [id number]   MEDICAL RECORD NO.:  [id number]          PATIENT TYPE:  EMS   LOCATION:  ED                           FACILITY:  [facility]   PHYSICIAN:  Almond Lint, MD       DATE OF BIRTH:  03/31/1961   DATE OF ADMISSION:  08/13/2008  DATE OF DISCHARGE:                              HISTORY & PHYSICAL   CHIEF COMPLAINT:  Abdominal pain.   HISTORY OF PRESENT ILLNESS:  Jose [name]  is a 50 year old male with a  history of diverticulitis in 2008 and more recently late October 2009  requiring admission.  He reportedly has an appointment with Dr. [name] on  the 23rd of this month to set up surgery.  He has been compliant with  his discharge medications and his low-residue diet.  He has been  completely pain free until last night after having some chicken stew.  He started having some diarrhea and over the course of the night  developed left lower quadrant abdominal pain that was so severe he got  very dizzy this morning.  He did not actually pass out and had no bloody  stools.  He describes this pain as a dull ache that is worse when he  moves.   PAST MEDICAL HISTORY:  Significant for hypertension and diverticulitis.   PAST SURGICAL HISTORY:  Negative.   FAMILY HISTORY:  Negative for any significant diseases or  diverticulitis.   SOCIAL HISTORY:  He is accompanied by his family and does not smoke or  drink alcohol.   DRUG ALLERGIES:  None.   HOME MEDICATIONS:  Cipro, Flagyl, Darvocet and  bisoprolol/hydrochlorothiazide.   REVIEW OF SYSTEMS:  Otherwise negative x11 systems other than HPI.   PHYSICAL EXAMINATION:  VITALS:  Temperature 100.5, pulse 80, respiratory  rate 18, 97% on room air, blood pressure 103/71.  GENERAL:  He is alert and oriented x3, in no acute distress.  HEENT:  Normocephalic, atraumatic.  Sclerae are anicteric.  HEART:  Regular rate and rhythm.  No murmurs.  LUNGS:  Clear to auscultation bilaterally.  No wheezes,  rales or  rhonchi.  ABDOMEN:  Soft, slightly distended, positive bowel sounds.  No rebound  or guarding.  Tender in the left lower quadrant.  EXTREMITIES:  Warm, well perfused.  SKIN:  There are no rashes seen.   LABORATORY DATA:  White count 13.6, K is slightly low at 3.4, creatinine  0.88.  LFTs:  His AST and ALT are very mildly elevated at 39 and 56.  UA  is negative.  Chest x-ray is negative for free air.  CT is pending.  He  has drank contrast but has not yet had the actual scan.   IMPRESSION:  Jose [name] is a 50 year old male with recurrent  diverticular disease.  It is unclear whether he has a micro perforation  that is contained versus an abscess.  He does not appear to have  generalized peritonitis so hopefully does not have a free perforation.  We will base our ultimate plan on the CT scan results but for now we  will  plan on admitting him with nonoperative therapy, with IV fluids, IV  antibiotics and bowel rest.      Almond Lint, MD  Electronically Signed     FB/MEDQ  D:  08/13/2008  T:  08/13/2008  Job:  [id number]

## 2011-02-12 NOTE — Discharge Summary (Signed)
NAME:  Jose [name], Jose [name] NO.:  [id number]   MEDICAL RECORD NO.:  [id number]          PATIENT TYPE:  INP   LOCATION:  1521                         FACILITY:  [facility]   PHYSICIAN:  Isidor Holts, M.D.  DATE OF BIRTH:  1961/03/11   DATE OF ADMISSION:  07/25/2008  DATE OF DISCHARGE:  07/30/2008                               DISCHARGE SUMMARY   PMD:  Dr. Gabriel Earing at [facility].   PRIMARY GASTROENTEROLOGIST:  Dr. Melvia Heaps.   DISCHARGE DIAGNOSES:  1. Acute diverticulitis with micro perforation.  2. Diet-controlled hypertension.   DISCHARGE MEDICATIONS:  1. Flagyl 500 mg p.o. t.i.d. for 14 days.  2. Ciprofloxacin 500 mg p.o. b.i.d. for 14 days.   PROCEDURES:  1. Abdominal/pelvic CT scan dated July 26, 2008.  This showed no      acute findings in the abdomen.  Pelvic CT showed sigmoid      diverticulosis with surrounding inflammatory change and      extraluminal gas compatible with diverticulitis and bowel      perforation, low-density collection within the wall of the sigmoid      colon may represent an intramural abscess, this measures maximally      16 mm.  2. Abdominal/pelvic CT scan dated July 30, 2008.  This showed no      acute findings in the abdomen.  There was fatty infiltration of the      liver.  Pelvic CT demonstrated overall acute diverticulitis with      contained perforation within the sigmoid mesentery, similar      compared to July 25, 2008.  As described the extraluminal gas      has coalesced into one larger gas collection with a few small      additional foci of gas.  Colonic fat stranding is slightly      decreased, a small (1.9 x 2.2 cm) low-density collection associated      with the sigmoid colon and extending cephalad may represent a small      intra-neural/pericolonic abscess, reactive lymph nodes in the left      lower quadrant.   CONSULTATIONS:  Dr. Darnell Level, surgeon.   ADMISSION HISTORY:  As in H and P notes of  July 25, 2008 dictated by  Dr. Vania Rea.  However, in brief this is a 50 year old male,  with known history of diverticulosis/diverticulitis, treated for same in  May 2008, status post colonoscopy July 2008, presenting with sudden  onset of left lower quadrant colicky abdominal pain, associated with  fever.  He went to [facility], was started on Avelox  and Darvocet and despite these medications, symptoms became  progressively worse.  He presented to the emergency department, where an  abdominal CT scan demonstrated acute diverticulitis with micro  perforation and he was admitted for further evaluation, investigation  and management.   CLINICAL COURSE:  1. Acute diverticulitis/micro perforation:  The patient was placed on      bowel rest, intravenous fluid hydration, parenteral Zosyn and      Flagyl.  Surgical consultation was kindly provided by  Dr. Darnell Level. For details of this consultation, refer to consultation      notes of July 26, 2008.  He has recommended conservative      management until acute inflammatory process has subsided, after      which semi-elective surgical resection of sigmoid colon will be      planned.  The patient responded to above-mentioned management      measures.  We were subsequently able to advance diet and by July 28, 2008, patient had become asymptomatic.  For findings on repeat      abdominal CT scan on July 30, 2008, refer to procedure list      above.  As patient was clinically asymptomatic on that day, with      normal WBC and no recurrence of pyrexia he was cleared by the      surgeons for discharge on July 30, 2008, with a further 14 days      of antibiotic therapy and follow-up with surgeons for definitive      treatment.   1. Hypertension:  The patient had a slight bump in blood pressure in      the first few days of hospitalization.  This was managed with low      salt diet.  The patient  did not require specific antihypertensive      therapy during the course of this hospitalization and as of July 30, 2008, blood pressure was normal at 132/87 mmHg.   DISPOSITION:  The patient was discharged on July 30, 2008.  He is  recommended to return to regular duties unless otherwise specified by  surgeons, on August 20, 2008.   DIET:  Heart-healthy/low fat, low residue.   ACTIVITY:  Recommended to increase activity slowly.  He is also  recommended to maintain adequate hydration, drink lots of water.   FOLLOW-UP INSTRUCTIONS:  Follow-up has been arranged with Dr. Manus Rudd, General Surgeon in 3 weeks, telephone number [phone].  The  patient has been instructed to call for an appointment      Isidor Holts, M.D.  Electronically Signed     CO/MEDQ  D:  07/30/2008  T:  07/30/2008  Job:  [id number]   cc:   Gabriel Earing, M.D.  Fax: [phone]   Barbette Hair. Arlyce Dice, MD,FACG  [address]  [place]  [place] [address]   [name]. [name], M.D.  [address] [place] [phone]  [place]

## 2011-02-12 NOTE — Assessment & Plan Note (Signed)
[facility]                         GASTROENTEROLOGY OFFICE NOTE   AKHILESH, SASSONE                       MRN:          [id number]  DATE:04/01/2007                            DOB:          Feb 19, 1961    OFFICE NOTE   PROBLEM:  Diverticulitis.  Mr. Cherry has returned for evaluation post hospitalization for acute  diverticulitis.  He was hospitalized from May 23 to Feb 26, 2007 for  acute diverticulitis.  CT showed a microperforation.  On antibiotic  therapy, Mr. Kloepfer has improved.  Currently, he has no GI complaints.   MEDICATIONS:  He is on no regular medications.   He has no allergies.   EXAM:  Pulse 72, blood pressure 106/76, weight 213.  HEENT:  EOMI. PERRLA. Sclerae are anicteric.  Conjunctivae are pink.  NECK:  Supple without thyromegaly, adenopathy or carotid bruits.  CHEST:  Clear to auscultation and percussion without adventitious  sounds.  CARDIAC:  Regular rhythm; normal S1 S2.  There are no murmurs, gallops  or rubs.  ABDOMEN:  Bowel sounds are normoactive.  Abdomen is soft, non-tender and  non-distended.  There are no abdominal masses, tenderness, splenic  enlargement or hepatomegaly.  EXTREMITIES:  Full range of motion.  No cyanosis, clubbing or edema.  RECTAL:  Deferred.   IMPRESSION:  Acute diverticulitis, complicated by microperforation,  resolved.   RECOMMENDATION:  1. Colonoscopy in approximately 4 to 6 weeks.  2. Continue fiber supplementation.     Barbette Hair. Arlyce Dice, MD,FACG  Electronically Signed    RDK/MedQ  DD: 04/01/2007  DT: 04/01/2007  Job #: [id number]   cc:   Gabriel Earing, M.D.  [name]. Tsuei, M.D.

## 2011-02-12 NOTE — H&P (Signed)
NAME:  Jose [name], Jose [name] NO.:  [id number]   MEDICAL RECORD NO.:  [id number]          PATIENT TYPE:  EMS   LOCATION:  ED                           FACILITY:  [facility]   PHYSICIAN:  Hillery Aldo, M.D.   DATE OF BIRTH:  12/09/1960   DATE OF ADMISSION:  02/20/2007  DATE OF DISCHARGE:                              HISTORY & PHYSICAL   PRIMARY CARE PHYSICIAN:  The patient is unassigned.   CHIEF COMPLAINT:  Abdominal pain accompanied by syncope.   HISTORY OF PRESENT ILLNESS:  The patient is a 50 year old male who  presented with a chief complaint of left lower quadrant abdominal pain  that has been worsening over the past 2 days.  The patient recently  traveled out of town to [place] and, upon return, developed left  lower quadrant abdominal pain.  Initially, there was no associated fever  or chills.  He thought he may have been constipated and took Mylanta to  alleviate the constipation.  The patient reports that he had a normal  bowel movement subsequently and then, shortly thereafter, had a loose  bowel movement.  He denies any hematochezia or melena.  The pain reached  a level of 8 out of 10 when it was at its worst and, this morning, the  pain was accompanied by nausea and diaphoresis as well as dizziness,  prompting him to ask his wife to bring him to the emergency department  for evaluation.  While waiting in the emergency department waiting room,  the patient had a syncopal episode and passed out.  He reports that his  left lower quadrant pain is now constant and localized to the left lower  quadrant.  He was given some narcotic pain medications which have eased  his pain to a level of 3 out of 10.  The CT scanning in the emergency  department does show a sigmoid diverticulitis with possible perforation  and he is, therefore, being admitted for IV antibiotics and possible  surgical consultation.   PAST MEDICAL HISTORY:  None.  The patient denies any  significant  illnesses or surgeries.   FAMILY HISTORY:  The patient's mother is alive in her 36s and has  diabetes.  The patient's father died in his 44s from pneumonia and  complications of alcoholism.  The patient has three sisters and two  brothers who are healthy.  His children are healthy.   SOCIAL HISTORY:  The patient is married.  He lives in [place] with  his spouse and children.  He works as a Water engineer.  He has a remote history of social tobacco use, none for the past 20  years.  He denies any alcohol or drug use.   ALLERGIES:  None.   MEDICATIONS:  None.   REVIEW OF SYSTEMS:  As noted in the elements of the HPI above,  otherwise, he has had diminished appetite over the past several days.  No significant weight loss or weight gain.  No chest pain, shortness of  breath, cough or dysuria.   PHYSICAL EXAM:  Temperature 99.3, pulse 109, respirations 22,  blood  pressure 132/85, O2 saturation 95% on room air.  GENERAL:  This a well-  developed, well-nourished, slightly obese male who is in mild distress  secondary to abdominal discomfort.  HEENT: Normocephalic, atraumatic.  PERRL.  EOMI.  Oropharynx is clear.  NECK:  Supple, no thyromegaly, no lymphadenopathy, no jugular venous  distension.  CHEST:  Lungs clear to auscultation bilaterally with good air movement.  HEART:  Tachycardiac rate, regular rhythm.  No murmurs, rubs, or  gallops.  ABDOMEN:  Distended.  He is exquisitely tender about the left lower  quadrant.  There is mild rebound and some guarding as well.  Bowel  sounds are diminished.  EXTREMITIES:  No clubbing, edema, or cyanosis.  SKIN:  Warm and dry.  No rashes.  NEUROLOGIC:  The patient is alert and oriented x3.  Nonfocal.   DATA REVIEW:  A CT scan of the abdomen shows sigmoid diverticulitis or  perforation with a few regional extraluminal gas bubbles but no abscess.   A 12-lead EKG shows normal sinus rhythm at a rate of 86 beats  per minute  with no S or T-wave changes.   LABORATORY DATA:  Sodium is 139, potassium 3.2, chloride 105, bicarb 25,  BUN 11, creatinine 1.11, glucose 149.  White blood cell count is 16.6,  hemoglobin 14, hematocrit 42.3, platelets 251,000 with an absolute  neutrophil count of 9.5.   ASSESSMENT/PLAN:  1. Diverticulitis with microperforation:  We will admit the patient      for intravenous antibiotic therapy.  He was given a dose of Cipro      and Flagyl in the emergency department.  Will change this to Zosyn.      Keep the patient nothing through the mouth except for ice chips.      Will monitor him in the step-down unit for signs of hemodynamic      instability.  Will obtain blood cultures x2.  2. Hypokalemia:  Will replete through the intravenous route.  3. Impaired fasting glucose:  The patient's glucose is currently      impaired.  We will check a hemoglobin A1c and check his capillary      blood glucoses and cover him with sliding scale insulin if      indicated.  4. Prophylaxis:  Will use PAS hose for deep vein thrombosis      prophylaxis and place the patient on intravenous Protonix for      gastrointestinal prophylaxis.      Hillery Aldo, M.D.  Electronically Signed     CR/MEDQ  D:  02/20/2007  T:  02/20/2007  Job:  [id number]

## 2011-02-12 NOTE — H&P (Signed)
NAME:  ABHIMANYU, CRUCES NO.:  [id number]   MEDICAL RECORD NO.:  [id number]          PATIENT TYPE:  EMS   LOCATION:  ED                           FACILITY:  [facility]   PHYSICIAN:  Vania Rea, M.D. DATE OF BIRTH:  Dec 09, 1960   DATE OF ADMISSION:  07/25/2008  DATE OF DISCHARGE:                              HISTORY & PHYSICAL   PRIMARY CARE PHYSICIAN:  Gabriel Earing, M.D. at [facility].   CHIEF COMPLAINT:  Fever and abdominal pain.   HISTORY OF PRESENT ILLNESS:  This is a 50 year old Hispanic gentleman  with past medical history of diverticulitis, treated on the hospitalist  service in May, 2008.  Subsequently had a colonoscopy in July, 2008 by  Dr. Ilda Mori, who felt everything was okay, and he did not need to  follow up with surgery for any type of surgical intervention.  Patient  has been well since then.  He has been trying to stick with a high-fiber  diet and has been having fairly regular bowel movements, but on  Saturday, three days ago, the patient had a sudden onset of left lower  quadrant colicky abdominal pain associated with fever.  Went to [facility] and was started on Avelox.  Despite taking Avelox and  Darvocet, patient has been getting worse, has been having fever and  chills.  Had a CAT scan done in the emergency room today which is  suggestive of progression of disease, and hospitalist service has been  called to assist with management.  Other than this, the patient's only  other problems are headaches associated with episode.  He is having  loose stools, which he says is due to drinking prune juice and says  problems started.  He is having no bloody stool.  He is having no nausea  or vomiting.   PAST MEDICAL HISTORY:  As noted above.   MEDICATIONS:  1. Avelox 400 mg daily.  2. Darvocet-N 50/325 1 tablet every 6 hours for the past three days.   ALLERGIES:  No known drug allergies.   SOCIAL HISTORY:  Denies alcohol,  tobacco, or illicit drug use.  He works  as a Water engineer.   FAMILY HISTORY:  Significant only for a mother with diabetes, otherwise  unremarkable.   REVIEW OF SYSTEMS:  Other than noted above, 10 point review of systems  is unremarkable.   PHYSICAL EXAMINATION:  An obese, middle-aged Hispanic gentleman lying on  the stretcher.  Does not appear to be in acute distress.  VITALS:  Temperature 101.8, pulse 120 on initial presentation,  respirations 20, blood pressure 159/93.  He is saturating at 96% on room  air.  Pain level initially 9.  He is now down to a 1/10.  His blood  pressure is settled down to 120/70.  HEENT:  Pupils are equal, round and reactive.  Mucous membranes are  pink.  He is anicteric.  He is not dehydrated.  No cervical lymphadenopathy or thyromegaly.  No jugular venous  distention.  CHEST:  Clear to auscultation bilaterally.  CARDIOVASCULAR:  Regular rhythm without  murmurs.  ABDOMEN:  Obese.  He has an umbilical hernia which is easily reducible  and small.  He has tenderness in the left lower quadrant.  He has normal  abdominal bowel sounds.  EXTREMITIES:  Without edema.  He has 2+ pulses bilaterally.  CENTRAL NERVOUS SYSTEM:  Cranial nerves II-XII are grossly intact.  He  has no focal neurologic deficits.   LABS:  White count 16,000, hemoglobin 14.7, MCV 86.9, platelets 233.  His absolute granulocyte count is 11.4.  His serum chemistries are  significant for a potassium of 3.4.  His BUN and creatinine is 9/0.96.  His liver function tests are unremarkable.  Urinalysis is significant  for 15 mg/dl ketones.  He has a moderate amount of hemoglobin.  Microscopy shows 7-10 white cells.   CT scan of the abdomen and pelvis shows sigmoid diverticulosis with  surrounding inflammatory change and extra-luminal gas compatible with  diverticulitis and bowel perforation.  Low density collection within the  wall of the sigmoid colon may represent an  intramural abscess.  This  measures maximally 16 mm.   ASSESSMENT:  1. Acute diverticulitis with perforation.  2. Hypertension, apparently controlled with diet.   PLAN:  Will admit this gentleman for antibiotics with Zosyn and Flagyl.  IV fluid hydration.  We understand Dr. Darnell Level for surgery has been  called and will be following with [place] in the hospital.  Other plans as  per orders.      Vania Rea, M.D.  Electronically Signed     LC/MEDQ  D:  07/26/2008  T:  07/26/2008  Job:  [id number]   cc:   Gabriel Earing, M.D.  Fax: [phone]

## 2011-02-12 NOTE — Op Note (Signed)
NAME:  Jose [name], Jose [name]              ACCOUNT NO.:  [id number]   MEDICAL RECORD NO.:  [id number]          PATIENT TYPE:  INP   LOCATION:  0010                         FACILITY:  [facility]   PHYSICIAN:  [name]. [name], M.D. DATE OF BIRTH:  1960-12-11   DATE OF PROCEDURE:  09/01/2008  DATE OF DISCHARGE:                               OPERATIVE REPORT   PREOPERATIVE DIAGNOSES:  1. Recurrent sigmoid diverticulitis.  2. Umbilical hernia.   POSTOPERATIVE DIAGNOSES:  1. Recurrent sigmoid diverticulitis.  2. Umbilical hernia.   PROCEDURE PERFORMED:  Laparoscopic assisted sigmoid colectomy, rigid  proctoscopy, umbilical hernia repair.   SURGEON:  [name]. [name], M.D., FACS   ANESTHESIA:  General endotracheal.   INDICATIONS:  The patient is a 50 year old male who presented with least  two significant episodes of sigmoid diverticulitis complicated by micro  perforation.  Colonoscopy showed no other significant findings.  After  his most recent episode of diverticulitis with small abscess formation,  we strongly recommended sigmoid colectomy.  The patient declined last  year after his first major episode but he consented after this latest  episode.  He presents now for elective sigmoid resection.   DESCRIPTION OF PROCEDURE:  The patient was brought to the operating  room, placed in the supine position on the operating table.  After an  adequate level of general anesthesia was obtained, the patient's legs  were placed in yellow fin stirrups in lithotomy position.  A Foley  catheter was placed under sterile technique.  The patient's abdomen was  prepped with Betadine and draped in sterile fashion.  A time-out was  taken to assure the proper patient, proper procedure.  The patient was  noted have a soft reducible umbilical hernia and I told him that we  would include this in our fascial closure.  We infiltrated an area just  below left costal margin with 0.25% Marcaine with epinephrine.  An  11-mm  port was used using the OptiVu technique to cannulate the peritoneal  cavity and insufflate CO2 maintaining maximal pressure of 15 mmHg.  A 5  mm port was placed in the left lower quadrant.  Another 5 mm port was  placed in the left upper quadrant.  We examined the peritoneal cavity.  A small tongue of omentum was seen entering the umbilical hernia.  This  was reduced with gentle traction and was taken down with cautery.  The  patient has a very localized area of inflammation in the sigmoid colon  densely adherent to the lateral abdominal wall.  We bluntly dissected  this free from the lateral abdominal wall.  No gross purulence was noted  but there was some necrotic fat in this area.  The rectum below this  area and the colon above this area seemed completely normal.  We made a  midline incision beginning at the lower edge of the umbilicus extending  inferiorly for about 7 cm.  We inserted the GelPort device.  Using a  combination of hand assistance and cautery, we mobilized the left colon  by dividing the lateral attachments and mobilizing it medially.  We  continued working down towards the pelvis and mobilized the mesentery of  the sigmoid colon and the upper rectum.  A window was created in the  mesentery of the upper rectum.  We then removed the GelPort device and  used a contour stapler to divide the upper rectum.  The LigaSure device  was then used take the mesentery back in its superior direction.  We  continued until we were well above the area of inflammation.  We then  divided the colon with cautery.  EEA sizers were used and we selected a  29 mm EEA stapler.  The anvil was secured into the proximal and with a 2-  0 Prolene pursestring suture.  We inspected for hemostasis which was  good.  The colon reached easily down into the pelvis to the rectal  stump.  I then passed the EEA stapler through his rectum and advanced it  to the end of the staple line.  This passed  easily with no difficulty.  The spike was advanced just anterior to the staple line and mated with  the anvil.  We then tightened down the stapler held it for about a  minute and then fired the stapler.  We had two intact donuts in the  stapler.  We inserted the rigid proctoscope and insufflated air after  filling the pelvis with saline.  A few air bubbles were seen escaping  from the anterior part of the anastomosis.  This area was then  reinforced with two interrupted 2-0 silk sutures.  We again tested our  anastomosis with insufflation and no leak was noted.  The anastomosis  was under no tension.  No bleeding was noted.  Both ends of the proximal  and distal anastomosis were quite viable.  We then reinserted the  GelPort device and reinsufflated.  We inserted the laparoscope.  A  couple of small bleeding areas were noted on the lateral abdominal wall  which were addressed with the cautery.  Once we were satisfied with  hemostasis, we made sure all of our sponges were removed.  The ports  were removed and pneumoperitoneum was released.  The fascia of the  midline incision was closed with #1 PDS.  We included the umbilical  hernia in the superior end of our closure.  The dermis of the umbilicus  was tacked down with a couple of interrupted 3-0 Vicryl sutures.  Staples were used to close all the incisions.  Dry dressings were  applied.  The patient was then extubated and brought to recovery in  stable condition.  All sponge, instrument, and needle counts correct.      [name]. Tsuei, M.D.  Electronically Signed     MKT/MEDQ  D:  09/01/2008  T:  09/01/2008  Job:  [id number]

## 2011-02-15 NOTE — Discharge Summary (Signed)
NAME:  Jose [name], Jose [name]              ACCOUNT NO.:  [id number]   MEDICAL RECORD NO.:  [id number]          PATIENT TYPE:  INP   LOCATION:  1536                         [facility]ACILITY:  [facility]   PHYSICIAN:  [name]. [name], M.D. DATE OF BIRTH:  Sep 19, 1961   DATE OF ADMISSION:  09/01/2008  DATE OF DISCHARGE:  09/06/2008                               DISCHARGE SUMMARY   ADMISSION DIAGNOSIS:  Recurrent sigmoid diverticulitis.   DISCHARGE DIAGNOSIS:  Recurrent sigmoid diverticulitis.   PROCEDURE:  Laparoscopic-assisted sigmoid colectomy   BRIEF HISTORY:  The patient is a 50 year old male who presents with a  year of recurrent episodes of sigmoid diverticulitis.  Last year we had  recommended sigmoid colectomy, but he chose not to have this done.  He  has had several complications with micro-perforations but no drainable  abscess.  He has responded quickly every time with antibiotics.  He is  currently on ciprofloxacin and Flagyl.  He presents now for elective  sigmoid colectomy.   HOSPITAL COURSE:  The patient was admitted after a bowel prep on  September 01, 2008.  He underwent a laparoscopic-assisted sigmoid  colectomy with a stapled EEA anastomosis.  He also small umbilical  hernia which was repaired with our fascial closure.  The patient  tolerated the procedure well and was transferred to the floor from the  recovery room.  We maintained him on clear liquids.  It took several  days to regain bowel function.  He had a small amount of flatus on  postop day #3.  His diet was slowly advanced and he was discharged home  on postop day #5 after having 3 bowel movements.   DISCHARGE INSTRUCTIONS:  Follow up 1 week for staple removal.  He is  given Percocet p.r.n. for pain.  Resume preop medications.  The patient  may shower.  Avoid any heavy lifting.  Regular diet.      [name]. Tsuei, M.D.  Electronically Signed     MKT/MEDQ  D:  09/27/2008  T:  09/27/2008  Job:  [id number]

## 2011-02-15 NOTE — Discharge Summary (Signed)
NAME:  Jose [name], Jose [name]              ACCOUNT NO.:  [id number]   MEDICAL RECORD NO.:  [id number]          PATIENT TYPE:  INP   LOCATION:  1533                         FACILITY:  [facility]   PHYSICIAN:  [name]. [name], M.D. DATE OF BIRTH:  27-Dec-1960   DATE OF ADMISSION:  08/13/2008  DATE OF DISCHARGE:  08/17/2008                               DISCHARGE SUMMARY   ADMISSION DIAGNOSIS:  Recurrent sigmoid diverticulitis.   DISCHARGE DIAGNOSIS:  Same.   PROCEDURE PERFORMED:  None.   This is a 50 year old male noted to my service who has had problems with  sigmoid diverticulitis.  He has a couple of recurrences.  He was  evaluated last year, we had recommended colectomy.  However the patient  chose not to have surgery done last year.  In October he was readmitted  with a flare-up of his diverticulitis.  At that time I saw the patient  in consultation and recommended elective resection.  He had set up an  appointment to see me in the office about setting up this surgery.  However, he developed left lower quadrant abdominal pain which was  fairly severe on the night of August 13, 2008.  He came to the  emergency department and was evaluated.  His white count was slightly  elevated at 13.6.  A CT scan was obtained which showed continued  inflammation around the sigmoid colon but no abscess large enough for  percutaneous drainage.  He was admitted and placed on IV antibiotics.  We kept him at bowel rest.  His pain resolved fairly quickly.  We  maintained him on Invanz for a couple of days.  On August 17, 2008 the  patient was without tenderness.  He had been afebrile, had a normal  white count.  He is tolerating a low-residue diet.  He is being  discharged home today on a low residue diet.  Follow-up in my office in  2 weeks to arrange surgery.      [name]. Tsuei, M.D.  Electronically Signed     MKT/MEDQ  D:  09/15/2008  T:  09/15/2008  Job:  [id number]

## 2011-07-01 LAB — URINALYSIS, ROUTINE W REFLEX MICROSCOPIC
Glucose, UA: NEGATIVE
Ketones, ur: 15 — AB
Leukocytes, UA: NEGATIVE
Protein, ur: 100 — AB
Urobilinogen, UA: 1

## 2011-07-01 LAB — COMPREHENSIVE METABOLIC PANEL
ALT: 42
AST: 18
Albumin: 3.1 — ABNORMAL LOW
Alkaline Phosphatase: 45
BUN: 9
CO2: 28
Calcium: 8.7
Chloride: 101
Creatinine, Ser: 0.92
GFR calc Af Amer: >60
GFR calc non Af Amer: >60
Glucose, Bld: 108 — ABNORMAL HIGH
Glucose, Bld: 109 — ABNORMAL HIGH
Potassium: 3.4 — ABNORMAL LOW
Sodium: 140
Total Bilirubin: 1.2
Total Protein: 6.6

## 2011-07-01 LAB — BASIC METABOLIC PANEL WITH GFR
BUN: 7
CO2: 27
Calcium: 8.7
Chloride: 100
Creatinine, Ser: 0.79
GFR calc non Af Amer: >60
Glucose, Bld: 96
Potassium: 3.5
Sodium: 139

## 2011-07-01 LAB — LIPID PANEL
HDL: 37 — ABNORMAL LOW
Total CHOL/HDL Ratio: 4
Triglycerides: 52
VLDL: 10

## 2011-07-01 LAB — CBC
HCT: 37.2 — ABNORMAL LOW
HCT: 37.4 — ABNORMAL LOW
HCT: 43.5
Hemoglobin: 12.6 — ABNORMAL LOW
Hemoglobin: 12.8 — ABNORMAL LOW
Hemoglobin: 13.1
Hemoglobin: 14.7
MCHC: 33.5
MCHC: 33.7
MCHC: 33.7
MCHC: 34.4
MCV: 86.7
MCV: 86.9
MCV: 87.4
Platelets: 222
Platelets: 233
Platelets: 281
RBC: 4.28
RBC: 4.29
RBC: 5
RDW: 14.8
RDW: 15.5
RDW: 15.5
WBC: 12 — ABNORMAL HIGH
WBC: 16 — ABNORMAL HIGH

## 2011-07-01 LAB — DIFFERENTIAL
Basophils Absolute: 0.1
Basophils Relative: 0
Eosinophils Absolute: 0
Eosinophils Relative: 0
Lymphocytes Relative: 17
Lymphs Abs: 2.8
Monocytes Absolute: 1.7 — ABNORMAL HIGH
Monocytes Relative: 11
Neutro Abs: 11.4 — ABNORMAL HIGH
Neutrophils Relative %: 71

## 2011-07-01 LAB — BASIC METABOLIC PANEL
CO2: 27
Calcium: 8.9
Chloride: 107
GFR calc Af Amer: >60
Glucose, Bld: 104 — ABNORMAL HIGH
Potassium: 3.7
Sodium: 142

## 2011-07-01 LAB — URINE MICROSCOPIC-ADD ON

## 2011-07-01 LAB — HEMOGLOBIN A1C: Hgb A1c MFr Bld: 6

## 2011-07-02 LAB — BASIC METABOLIC PANEL
BUN: 11 mg/dL (ref 6–23)
CO2: 27
Calcium: 9.2 mg/dL (ref 8.4–10.5)
GFR calc non Af Amer: >60 mL/min (ref >60)
Glucose, Bld: 94 mg/dL (ref 70–99)
Glucose, Bld: 101 — ABNORMAL HIGH
Potassium: 3.3 — ABNORMAL LOW
Sodium: 142 mEq/L (ref 135–145)
Sodium: 138

## 2011-07-02 LAB — COMPREHENSIVE METABOLIC PANEL
ALT: 56 — ABNORMAL HIGH
Alkaline Phosphatase: 36 — ABNORMAL LOW
CO2: 29
Glucose, Bld: 126 — ABNORMAL HIGH
Potassium: 3.4 — ABNORMAL LOW
Sodium: 139
Total Protein: 7

## 2011-07-02 LAB — CBC
HCT: 37 — ABNORMAL LOW
HCT: 37.7 — ABNORMAL LOW
HCT: 41.1
Hemoglobin: 14.6 g/dL (ref 13.0–17.0)
Hemoglobin: 12.7 — ABNORMAL LOW
Hemoglobin: 15
MCHC: 33.5
MCV: 85.7
MCV: 85.9
Platelets: 213 10*3/uL (ref 150–400)
Platelets: 208
Platelets: 236
RBC: 5.21
RDW: 15.3 % (ref 11.5–15.5)
RDW: 14.9
RDW: 15
RDW: 15.3
RDW: 15.3
WBC: 6.5

## 2011-07-02 LAB — DIFFERENTIAL
Basophils Absolute: 0 10*3/uL (ref 0.0–0.1)
Basophils Relative: 0
Eosinophils Absolute: 0
Eosinophils Relative: 0
Lymphocytes Relative: 44 % (ref 12–46)
Monocytes Relative: 4
Neutro Abs: 2.5 10*3/uL (ref 1.7–7.7)
Neutrophils Relative %: 86 — ABNORMAL HIGH

## 2011-07-02 LAB — URINALYSIS, ROUTINE W REFLEX MICROSCOPIC
Nitrite: NEGATIVE
Specific Gravity, Urine: 1.012
pH: 5.5

## 2011-07-05 LAB — CBC
Hemoglobin: 13.2 g/dL (ref 13.0–17.0)
MCV: 84.6 fL (ref 78.0–100.0)
RBC: 4.6 MIL/uL (ref 4.22–5.81)
WBC: 10 10*3/uL (ref 4.0–10.5)

## 2011-07-05 LAB — BASIC METABOLIC PANEL
BUN: 3 mg/dL — ABNORMAL LOW (ref 6–23)
CO2: 27 mEq/L (ref 19–32)
Calcium: 8.3 mg/dL — ABNORMAL LOW (ref 8.4–10.5)
Glucose, Bld: 118 mg/dL — ABNORMAL HIGH (ref 70–99)
Potassium: 3.6 mEq/L (ref 3.5–5.1)

## 2014-03-24 ENCOUNTER — Encounter: Payer: Self-pay | Admitting: Gastroenterology

## 2015-08-17 ENCOUNTER — Encounter: Payer: Self-pay | Admitting: Gastroenterology

## 2017-03-20 ENCOUNTER — Encounter: Payer: Self-pay | Admitting: Gastroenterology

## 2022-12-19 ENCOUNTER — Emergency Department (HOSPITAL_BASED_OUTPATIENT_CLINIC_OR_DEPARTMENT_OTHER): Payer: 59

## 2022-12-19 ENCOUNTER — Encounter (HOSPITAL_BASED_OUTPATIENT_CLINIC_OR_DEPARTMENT_OTHER): Payer: Self-pay

## 2022-12-19 ENCOUNTER — Other Ambulatory Visit (HOSPITAL_BASED_OUTPATIENT_CLINIC_OR_DEPARTMENT_OTHER): Payer: Self-pay

## 2022-12-19 ENCOUNTER — Other Ambulatory Visit: Payer: Self-pay

## 2022-12-19 ENCOUNTER — Emergency Department (HOSPITAL_BASED_OUTPATIENT_CLINIC_OR_DEPARTMENT_OTHER)
Admission: EM | Admit: 2022-12-19 | Discharge: 2022-12-19 | Disposition: A | Discharge status: Home / Self Care | Payer: 59 | Attending: Emergency Medicine | Admitting: Emergency Medicine

## 2022-12-19 DIAGNOSIS — R42 Dizziness and giddiness: Secondary | ICD-10-CM | POA: Diagnosis not present

## 2022-12-19 DIAGNOSIS — U071 COVID-19: Secondary | ICD-10-CM

## 2022-12-19 DIAGNOSIS — Z79899 Other long term (current) drug therapy: Secondary | ICD-10-CM | POA: Insufficient documentation

## 2022-12-19 DIAGNOSIS — Z87891 Personal history of nicotine dependence: Secondary | ICD-10-CM | POA: Insufficient documentation

## 2022-12-19 DIAGNOSIS — E119 Type 2 diabetes mellitus without complications: Secondary | ICD-10-CM | POA: Insufficient documentation

## 2022-12-19 DIAGNOSIS — Z7984 Long term (current) use of oral hypoglycemic drugs: Secondary | ICD-10-CM | POA: Diagnosis not present

## 2022-12-19 DIAGNOSIS — I10 Essential (primary) hypertension: Secondary | ICD-10-CM | POA: Diagnosis not present

## 2022-12-19 DIAGNOSIS — R112 Nausea with vomiting, unspecified: Secondary | ICD-10-CM | POA: Diagnosis present

## 2022-12-19 DIAGNOSIS — E041 Nontoxic single thyroid nodule: Secondary | ICD-10-CM | POA: Diagnosis not present

## 2022-12-19 HISTORY — DX: Essential (primary) hypertension: I10

## 2022-12-19 HISTORY — DX: Type 2 diabetes mellitus without complications: E11.9

## 2022-12-19 LAB — URINALYSIS, ROUTINE W REFLEX MICROSCOPIC
Bacteria, UA: NONE SEEN
Bilirubin Urine: NEGATIVE
Glucose, UA: 500 mg/dL — AB
Hgb urine dipstick: NEGATIVE
Ketones, ur: 15 mg/dL — AB
Leukocytes,Ua: NEGATIVE
Nitrite: NEGATIVE
Protein, ur: NEGATIVE mg/dL
Specific Gravity, Urine: 1.018 (ref 1.005–1.030)
pH: 7 (ref 5.0–8.0)

## 2022-12-19 LAB — COMPREHENSIVE METABOLIC PANEL
ALT: 42 U/L (ref 0–44)
AST: 26 U/L (ref 15–41)
Albumin: 4.7 g/dL (ref 3.5–5.0)
Alkaline Phosphatase: 56 U/L (ref 38–126)
Anion gap: 12 (ref 5–15)
BUN: 12 mg/dL (ref 8–23)
CO2: 25 mmol/L (ref 22–32)
Calcium: 9.1 mg/dL (ref 8.9–10.3)
Chloride: 101 mmol/L (ref 98–111)
Creatinine, Ser: 0.6 mg/dL — ABNORMAL LOW (ref 0.61–1.24)
GFR, Estimated: >60 mL/min (ref >60)
Glucose, Bld: 280 mg/dL — ABNORMAL HIGH (ref 70–99)
Potassium: 3.5 mmol/L (ref 3.5–5.1)
Sodium: 138 mmol/L (ref 135–145)
Total Bilirubin: 0.6 mg/dL (ref 0.3–1.2)
Total Protein: 7.4 g/dL (ref 6.5–8.1)

## 2022-12-19 LAB — CBC WITH DIFFERENTIAL/PLATELET
Abs Immature Granulocytes: 0.04 10*3/uL (ref 0.00–0.07)
Basophils Absolute: 0.1 10*3/uL (ref 0.0–0.1)
Basophils Relative: 1 %
Eosinophils Absolute: 0 10*3/uL (ref 0.0–0.5)
Eosinophils Relative: 0 %
HCT: 44.8 % (ref 39.0–52.0)
Hemoglobin: 15 g/dL (ref 13.0–17.0)
Immature Granulocytes: 1 %
Lymphocytes Relative: 21 %
Lymphs Abs: 1.7 10*3/uL (ref 0.7–4.0)
MCH: 28.1 pg (ref 26.0–34.0)
MCHC: 33.5 g/dL (ref 30.0–36.0)
MCV: 83.9 fL (ref 80.0–100.0)
Monocytes Absolute: 0.3 10*3/uL (ref 0.1–1.0)
Monocytes Relative: 4 %
Neutro Abs: 5.7 10*3/uL (ref 1.7–7.7)
Neutrophils Relative %: 73 %
Platelets: 233 10*3/uL (ref 150–400)
RBC: 5.34 MIL/uL (ref 4.22–5.81)
RDW: 14.3 % (ref 11.5–15.5)
WBC: 7.9 10*3/uL (ref 4.0–10.5)
nRBC: 0 % (ref 0.0–0.2)

## 2022-12-19 LAB — MAGNESIUM: Magnesium: 1.7 mg/dL (ref 1.7–2.4)

## 2022-12-19 LAB — LIPASE, BLOOD: Lipase: 35 U/L (ref 11–51)

## 2022-12-19 LAB — RESP PANEL BY RT-PCR (RSV, FLU A&B, COVID)  RVPGX2
Influenza A by PCR: NEGATIVE
Influenza B by PCR: NEGATIVE
Resp Syncytial Virus by PCR: NEGATIVE
SARS Coronavirus 2 by RT PCR: POSITIVE — AB

## 2022-12-19 LAB — TROPONIN I (HIGH SENSITIVITY): Troponin I (High Sensitivity): 3 ng/L (ref <18)

## 2022-12-19 MED ORDER — IOHEXOL 350 MG/ML SOLN
100.0000 mL | Freq: Once | INTRAVENOUS | Status: AC | PRN
  Administered 2022-12-19: 75 mL via INTRAVENOUS

## 2022-12-19 MED ORDER — MECLIZINE HCL 25 MG PO TABS
25.0000 mg | ORAL_TABLET | Freq: Once | ORAL | Status: AC
  Administered 2022-12-19: 25 mg via ORAL
  Filled 2022-12-19: qty 1

## 2022-12-19 MED ORDER — MECLIZINE HCL 25 MG PO TABS
25.0000 mg | ORAL_TABLET | Freq: Two times a day (BID) | ORAL | 0 refills | Status: AC | PRN
  Filled 2022-12-19: qty 20, 10d supply, fill #0

## 2022-12-19 MED ORDER — PAXLOVID (300/100) 20 X 150 MG & 10 X 100MG PO TBPK
3.0000 | ORAL_TABLET | Freq: Two times a day (BID) | ORAL | 0 refills | Status: AC
  Filled 2022-12-19: qty 30, 5d supply, fill #0

## 2022-12-19 MED ORDER — SODIUM CHLORIDE 0.9 % IV BOLUS
1000.0000 mL | Freq: Once | INTRAVENOUS | Status: AC
  Administered 2022-12-19: 1000 mL via INTRAVENOUS

## 2022-12-19 MED ORDER — ONDANSETRON HCL 4 MG/2ML IJ SOLN
4.0000 mg | Freq: Once | INTRAMUSCULAR | Status: AC
  Administered 2022-12-19: 4 mg via INTRAVENOUS
  Filled 2022-12-19: qty 2

## 2022-12-19 NOTE — ED Notes (Signed)
CBG results 265 reported to Colin Ina, RN

## 2022-12-19 NOTE — Discharge Instructions (Addendum)
It was a pleasure caring for you today in the emergency department.  Please return to the emergency department for any worsening or worrisome symptoms.  Please do not take your atorvastatin while taking Paxlovid and do not take for 2-3 days after completion.   Please take half of your blood pressure medication/amlodipine(norvasc) while taking Paxlovid and for 2 to 3 days after completion.  Please check your blood pressure daily at home and if your blood pressure drops too low do not take your Norvasc that day.

## 2022-12-19 NOTE — ED Triage Notes (Signed)
Onset with waking became dizzy and then vomited nausea.  Having chills.  At present a little dizzy.  Dizziness worse with lying on side

## 2022-12-19 NOTE — ED Notes (Signed)
Pt discharged to home. NAD noted at time of discharge

## 2022-12-19 NOTE — ED Provider Notes (Signed)
[facility] Provider Note  CSN: [id number] Arrival date & time: 12/19/22 1028  Chief Complaint(s) Dizziness  HPI Jose [name] is a 62 y.o. male with past medical history as below, significant for DM, hypertension, diverticulosis who presents to the ED with complaint of room spinning sensation, dizziness, nausea, vomiting  Onset this morning upon waking up.  He sat up quickly and sat at the edge of bed, felt as though the room was spinning, he laid back down in the room continue to span.  Gradually improved over the next few minutes.  Provoked again when he stood up and walked from the bedroom to the bathroom.  Felt nauseated and he vomited.  No falls or head injuries.  No fevers.  No recent medication or diet changes.  No history of vertigo.  No recent sinusitis or nasal congestion.  No tinnitus or hearing loss.  No numbness or tingling to extremities.  Symptoms provoked by head and eye movement.  Improved by sitting down and closing eyes.  Past Medical History Past Medical History:  Diagnosis Date   Diabetes mellitus without complication ([place])    Hypertension    Patient Active Problem List   Diagnosis Date Noted   CHEST PAIN 08/14/2010   HYPERTENSION 06/15/2010   GERD 06/15/2010   DIVERTICULOSIS OF COLON 01/29/2008   COLONIC POLYPS, HX OF 01/29/2008   Home Medication(s) Prior to Admission medications   Medication Sig Start Date End Date Taking? Authorizing Provider  amLODipine (NORVASC) 10 MG tablet Take 10 mg by mouth daily. 06/24/22  Yes [provider]  atorvastatin (LIPITOR) 20 MG tablet Take 20 mg by mouth daily. 07/17/22  Yes [provider]  glimepiride (AMARYL) 4 MG tablet Take 4 mg by mouth daily with breakfast. 10/31/22 04/29/23 Yes [provider]  irbesartan-hydrochlorothiazide (AVALIDE) 300-12.5 MG tablet Take 1 tablet by mouth daily. 02/13/22  Yes [provider]  meclizine (ANTIVERT) 25 MG  tablet Take 1 tablet (25 mg total) by mouth 2 (two) times daily as needed for dizziness. 12/19/22  Yes Wynona Dove A, DO  metFORMIN (GLUCOPHAGE-XR) 500 MG 24 hr tablet Take 1,000 mg by mouth 2 (two) times daily. 11/26/22  Yes [provider]  nirmatrelvir & ritonavir (PAXLOVID, 300/100,) 20 x 150 MG & 10 x 100MG  TBPK Take 3 tablets by mouth 2 (two) times daily for 5 days. 12/19/22 12/24/22 Yes Jeanell Sparrow, DO                                                                                                                                    Past Surgical History History reviewed. No pertinent surgical history. Family History History reviewed. No pertinent family history.  Social History Social History   Tobacco Use   Smoking status: Former    Types: Cigarettes   Smokeless tobacco: Never  Substance Use Topics   Alcohol use: Not Currently  Drug use: Not Currently   Allergies Patient has no allergy information on record.  Review of Systems Review of Systems  Constitutional:  Negative for chills and fever.  HENT:  Negative for facial swelling and trouble swallowing.   Eyes:  Negative for photophobia and visual disturbance.  Respiratory:  Negative for cough and shortness of breath.   Cardiovascular:  Negative for chest pain and palpitations.  Gastrointestinal:  Positive for nausea and vomiting. Negative for abdominal pain.  Endocrine: Negative for polydipsia and polyuria.  Genitourinary:  Negative for difficulty urinating and hematuria.  Musculoskeletal:  Negative for gait problem and joint swelling.  Skin:  Negative for pallor and rash.  Neurological:  Positive for dizziness. Negative for syncope and headaches.  Psychiatric/Behavioral:  Negative for agitation and confusion.     Physical Exam Vital Signs  I have reviewed the triage vital signs BP (!) 167/90   Pulse 71   Temp 97.8 F (36.6 C)   Resp 18   Ht 5\' 8"  (1.727 m)   Wt 103 kg   SpO2 99%   BMI 34.52 kg/m   Physical Exam Vitals and nursing note reviewed.  Constitutional:      General: He is not in acute distress.    Appearance: Normal appearance. He is well-developed. He is obese. He is not ill-appearing.  HENT:     Head: Normocephalic and atraumatic. No Battle's sign, right periorbital erythema or left periorbital erythema.     Jaw: There is normal jaw occlusion.     Right Ear: Tympanic membrane and external ear normal.     Left Ear: Tympanic membrane and external ear normal.     Mouth/Throat:     Mouth: Mucous membranes are moist.  Eyes:     General: No scleral icterus.    Extraocular Movements: Extraocular movements intact.     Pupils: Pupils are equal, round, and reactive to light.  Cardiovascular:     Rate and Rhythm: Normal rate and regular rhythm.     Pulses: Normal pulses.     Heart sounds: Normal heart sounds.  Pulmonary:     Effort: Pulmonary effort is normal. No respiratory distress.     Breath sounds: Normal breath sounds.  Abdominal:     General: Abdomen is flat.     Palpations: Abdomen is soft.     Tenderness: There is no abdominal tenderness.  Musculoskeletal:     Cervical back: No rigidity.     Right lower leg: No edema.     Left lower leg: No edema.  Skin:    General: Skin is warm and dry.     Capillary Refill: Capillary refill takes less than 2 seconds.  Neurological:     Mental Status: He is alert and oriented to person, place, and time.     GCS: GCS eye subscore is 4. GCS verbal subscore is 5. GCS motor subscore is 6.     Cranial Nerves: Cranial nerves 2-12 are intact. No dysarthria.     Sensory: Sensation is intact.     Motor: Motor function is intact. No tremor.     Coordination: Coordination is intact. Finger-Nose-Finger Test normal.     Comments: Hints exam consistent with peripheral source Strength 5/5 symmetric bilateral upper and lower extremities  Psychiatric:        Mood and Affect: Mood normal.        Behavior: Behavior normal.     ED  Results and Treatments Labs (all labs ordered are listed, but  only abnormal results are displayed) Labs Reviewed  RESP PANEL BY RT-PCR (RSV, FLU A&B, COVID)  RVPGX2 - Abnormal; Notable for the following components:      Result Value   SARS Coronavirus 2 by RT PCR POSITIVE (*)    All other components within normal limits  COMPREHENSIVE METABOLIC PANEL - Abnormal; Notable for the following components:   Glucose, Bld 280 (*)    Creatinine, Ser 0.60 (*)    All other components within normal limits  URINALYSIS, ROUTINE W REFLEX MICROSCOPIC - Abnormal; Notable for the following components:   Color, Urine COLORLESS (*)    Glucose, UA 500 (*)    Ketones, ur 15 (*)    All other components within normal limits  CBC WITH DIFFERENTIAL/PLATELET  LIPASE, BLOOD  MAGNESIUM  TROPONIN I (HIGH SENSITIVITY)                                                                                                                          Radiology CT ANGIO HEAD NECK W WO CM  Result Date: 12/19/2022 CLINICAL DATA:  Provided history: Vertigo. Dizziness. Vomiting. Chills. History of hypertension and diabetes. EXAM: CT ANGIOGRAPHY HEAD AND NECK TECHNIQUE: Multidetector CT imaging of the head and neck was performed using the standard protocol during bolus administration of intravenous contrast. Multiplanar CT image reconstructions and MIPs were obtained to evaluate the vascular anatomy. Carotid stenosis measurements (when applicable) are obtained utilizing NASCET criteria, using the distal internal carotid diameter as the denominator. RADIATION DOSE REDUCTION: This exam was performed according to the departmental dose-optimization program which includes automated exposure control, adjustment of the mA and/or kV according to patient size and/or use of iterative reconstruction technique. CONTRAST:  64mL OMNIPAQUE IOHEXOL 350 MG/ML SOLN COMPARISON:  No pertinent prior exams available for comparison. FINDINGS: CT HEAD FINDINGS  Brain: Cerebral volume is normal. There is no acute intracranial hemorrhage. No demarcated cortical infarct. No extra-axial fluid collection. No evidence of an intracranial mass. No midline shift. Vascular: No hyperdense vessel. Atherosclerotic calcifications Skull: No fracture or aggressive osseous lesion. Sinuses/Orbits: No orbital mass or acute orbital finding. Minimal mucosal thickening within the bilateral maxillary sinuses. Mild mucosal thickening and/or secretions within the inferior frontal sinuses, bilaterally. Review of the MIP images confirms the above findings CTA NECK FINDINGS Aortic arch: Standard aortic branching. Atherosclerotic plaque within the aortic arch and proximal major branch vessels of the neck. Streak and beam hardening artifact arising from a dense right-sided contrast bolus partially obscures the right subclavian artery. Within this limitation, there is no appreciable hemodynamically significant innominate or proximal subclavian artery stenosis. Right carotid system: Streak/beam hardening artifact arising from a dense right-sided contrast bolus partially obscures the proximal common carotid artery. Within this limitation, the common carotid and internal carotid arteries are patent within the neck without stenosis or significant atherosclerotic disease. Left carotid system: CCA and ICA patent within the neck without stenosis or significant atherosclerotic disease Vertebral arteries: Vertebral arteries patent within the neck without  stenosis or significant atherosclerotic disease. The left vertebral artery is dominant. Skeleton: Cervical spondylosis. No acute fracture or aggressive osseous lesion. Other neck: 1.8 cm nodule within the right thyroid lobe (series 11, image 84). No cervical lymphadenopathy Upper chest: No consolidation within the imaged lung apices. Review of the MIP images confirms the above findings CTA HEAD FINDINGS Anterior circulation: The intracranial internal carotid  arteries are patent. Nonstenotic atherosclerotic plaque within both vessels. The M1 middle cerebral arteries are patent. No M2 proximal branch occlusion or high-grade proximal stenosis. The anterior cerebral arteries are patent. Posterior circulation: The intracranial vertebral arteries are patent. The basilar artery is patent. Fenestration within the proximal basilar artery. The posterior cerebral arteries are patent. Venous sinuses: Within the limitations of contrast timing, no convincing thrombus. Anatomic variants: As described. Review of the MIP images confirms the above findings IMPRESSION: CT head: 1.  No evidence of an acute intracranial abnormality. 2. Mild paranasal sinus disease, as described. CTA neck: 1. Streak/beam hardening artifact arising from a dense right-sided contrast bolus partially obscures the proximal right common carotid artery. 2. Within this limitation, the common carotid, internal carotid and vertebral arteries are patent within the neck without stenosis or significant atherosclerotic disease. 3. 1.8 cm nodule within the right thyroid lobe. A non-emergent thyroid ultrasound is recommended for further evaluation. Reference: J Am Coll Radiol. 2015 Feb;12(2): 143-50. CTA head: 1. No intracranial large vessel occlusion or proximal high-grade arterial stenosis identified. 2. Non-stenotic atherosclerotic plaque within the intracranial internal carotid arteries. 3. 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   On: 12/19/2022 14:48    Pertinent labs & imaging results that were available during my care of the patient were reviewed by me and considered in my medical decision making (see MDM for details).  Medications Ordered in ED Medications  sodium chloride 0.9 % bolus 1,000 mL (0 mLs Intravenous Stopped 12/19/22 1350)  meclizine (ANTIVERT) tablet 25 mg (25 mg Oral Given 12/19/22 1228)  ondansetron (ZOFRAN) injection 4 mg  (4 mg Intravenous Given 12/19/22 1228)  iohexol (OMNIPAQUE) 350 MG/ML injection 100 mL (75 mLs Intravenous Contrast Given 12/19/22 1401)                                                                                                                                     Procedures Procedures  (including critical care time)  Medical Decision Making / ED Course    Medical Decision Making:    Jose [name] is a 62 y.o. male with past medical history as below, significant for DM, hypertension, diverticulosis who presents to the ED with complaint of room spinning sensation, dizziness, nausea, vomiting. The complaint involves an extensive differential diagnosis and also carries with it a high risk of complications and morbidity.  Serious etiology was considered. Ddx includes but is not limited to: Vertigo, peripheral vertigo, CVA, vascular abnormality, sinusitis,  neoplasm, etc.  Complete initial physical exam performed, notably the patient  was no acute distress, HDS.  Neuro nonfocal.    Reviewed and confirmed nursing documentation for past medical history, family history, social history.  Vital signs reviewed.    Clinical Course as of 12/19/22 1526  Thu Dec 19, 2022  1308 SARS Coronavirus 2 by RT PCR(!): POSITIVE [SG]  1347 Greatly improved, dizziness resolved. No nausea  [SG]  1456 "3. 1.8 cm nodule within the right thyroid lobe. A non-emergent thyroid ultrasound is recommended for further evaluation. Reference: J Am Coll Radiol. 2015 Feb;12(2): 143-50" [SG]    Clinical Course User Index [SG] Jeanell Sparrow, DO   Labs reviewed and are stable.  Symptoms resolved following Antivert, IV fluids and Zofran. Imaging stable Patient found to have COVID-19, its potential provoking factor in patient's vertigo.  Will start Paxlovid.  Have patient follow-up PCP and ENT as needed. thyroid nodule noted on CTA, follow-up PCP for further evaluation.   Statin and amlodipine dosing adjustments  discussed with patient.  Patient presents with vertigo. On initial evaluation patient appears in no acute distress, afebrile with normal vital signs. Vertigo most suggestive of peripheral cause. Neuro intact without sign of CNS ischemia or other serious etiology. DC on Meclizine with close PCP F/U. Warnings discussed.   Patient in no distress and overall condition improved here in the ED. Detailed discussions were had with the patient regarding current findings, and need for close f/u with PCP or on call doctor. The patient has been instructed to return immediately if the symptoms worsen in any way for re-evaluation. Patient verbalized understanding and is in agreement with current care plan. All questions answered prior to discharge.  Additional history obtained: -Additional history obtained from family -External records from outside source obtained and reviewed including: Chart review including previous notes, labs, imaging, consultation notes including home medications, prior labs and imaging, primary care documentation   Lab Tests: -I ordered, reviewed, and interpreted labs.   The pertinent results include:   Labs Reviewed  RESP PANEL BY RT-PCR (RSV, FLU A&B, COVID)  RVPGX2 - Abnormal; Notable for the following components:      Result Value   SARS Coronavirus 2 by RT PCR POSITIVE (*)    All other components within normal limits  COMPREHENSIVE METABOLIC PANEL - Abnormal; Notable for the following components:   Glucose, Bld 280 (*)    Creatinine, Ser 0.60 (*)    All other components within normal limits  URINALYSIS, ROUTINE W REFLEX MICROSCOPIC - Abnormal; Notable for the following components:   Color, Urine COLORLESS (*)    Glucose, UA 500 (*)    Ketones, ur 15 (*)    All other components within normal limits  CBC WITH DIFFERENTIAL/PLATELET  LIPASE, BLOOD  MAGNESIUM  TROPONIN I (HIGH SENSITIVITY)    Notable for COVID-positive  EKG   EKG Interpretation  Date/Time:     Ventricular Rate:    PR Interval:    QRS Duration:   QT Interval:    QTC Calculation:   R Axis:     Text Interpretation:           Imaging Studies ordered: I ordered imaging studies including CTA I independently visualized the following imaging with scope of interpretation limited to determining acute life threatening conditions related to emergency care; findings noted above, significant for thyroid nodule, otherwise no acute abnormalities I independently visualized and interpreted imaging. I agree with the radiologist interpretation   Medicines ordered and  prescription drug management: Meds ordered this encounter  Medications   sodium chloride 0.9 % bolus 1,000 mL   meclizine (ANTIVERT) tablet 25 mg   ondansetron (ZOFRAN) injection 4 mg   iohexol (OMNIPAQUE) 350 MG/ML injection 100 mL   meclizine (ANTIVERT) 25 MG tablet    Sig: Take 1 tablet (25 mg total) by mouth 2 (two) times daily as needed for dizziness.    Dispense:  20 tablet    Refill:  0   nirmatrelvir & ritonavir (PAXLOVID, 300/100,) 20 x 150 MG & 10 x 100MG  TBPK    Sig: Take 3 tablets by mouth 2 (two) times daily for 5 days.    Dispense:  30 tablet    Refill:  0    -I have reviewed the patients home medicines and have made adjustments as needed   Consultations Obtained: na   Cardiac Monitoring: The patient was maintained on a cardiac monitor.  I personally viewed and interpreted the cardiac monitored which showed an underlying rhythm of: Sinus rhythm  Social Determinants of Health:  Diagnosis or treatment significantly limited by social determinants of health: former smoker and obesity   Reevaluation: After the interventions noted above, I reevaluated the patient and found that they have resolved  Co morbidities that complicate the patient evaluation  Past Medical History:  Diagnosis Date   Diabetes mellitus without complication ([place])    Hypertension       Dispostion: Disposition decision  including need for hospitalization was considered, and patient discharged from emergency department.    Final Clinical Impression(s) / ED Diagnoses Final diagnoses:  Vertigo  COVID-19  Thyroid nodule     This chart was dictated using voice recognition software.  Despite best efforts to proofread,  errors can occur which can change the documentation meaning.    Wynona Dove A, DO 12/19/22 1526

## 2022-12-20 LAB — CBG MONITORING, ED: Glucose-Capillary: 265 mg/dL — ABNORMAL HIGH (ref 70–99)

## 2024-01-27 ENCOUNTER — Other Ambulatory Visit (HOSPITAL_COMMUNITY): Payer: Self-pay
